# Patient Record
Sex: Female | Born: 1980
Health system: Southern US, Community
[De-identification: ages and names within clinical notes are randomized; demographics above are authoritative.]

## PROBLEM LIST (undated history)

## (undated) ENCOUNTER — Inpatient Hospital Stay (HOSPITAL_COMMUNITY): Payer: Self-pay

## (undated) DIAGNOSIS — R12 Heartburn: Secondary | ICD-10-CM

## (undated) DIAGNOSIS — J45909 Unspecified asthma, uncomplicated: Secondary | ICD-10-CM

## (undated) DIAGNOSIS — K829 Disease of gallbladder, unspecified: Secondary | ICD-10-CM

## (undated) DIAGNOSIS — M2550 Pain in unspecified joint: Secondary | ICD-10-CM

## (undated) DIAGNOSIS — R0602 Shortness of breath: Secondary | ICD-10-CM

## (undated) DIAGNOSIS — Z8619 Personal history of other infectious and parasitic diseases: Secondary | ICD-10-CM

## (undated) DIAGNOSIS — Z91018 Allergy to other foods: Secondary | ICD-10-CM

## (undated) DIAGNOSIS — E559 Vitamin D deficiency, unspecified: Secondary | ICD-10-CM

## (undated) DIAGNOSIS — J302 Other seasonal allergic rhinitis: Secondary | ICD-10-CM

## (undated) DIAGNOSIS — R7303 Prediabetes: Secondary | ICD-10-CM

## (undated) DIAGNOSIS — M1990 Unspecified osteoarthritis, unspecified site: Secondary | ICD-10-CM

## (undated) DIAGNOSIS — O139 Gestational [pregnancy-induced] hypertension without significant proteinuria, unspecified trimester: Secondary | ICD-10-CM

## (undated) DIAGNOSIS — F32A Depression, unspecified: Secondary | ICD-10-CM

## (undated) DIAGNOSIS — O24419 Gestational diabetes mellitus in pregnancy, unspecified control: Secondary | ICD-10-CM

## (undated) DIAGNOSIS — L509 Urticaria, unspecified: Secondary | ICD-10-CM

## (undated) DIAGNOSIS — G4730 Sleep apnea, unspecified: Secondary | ICD-10-CM

## (undated) DIAGNOSIS — F419 Anxiety disorder, unspecified: Secondary | ICD-10-CM

## (undated) DIAGNOSIS — I10 Essential (primary) hypertension: Secondary | ICD-10-CM

## (undated) HISTORY — DX: Prediabetes: R73.03

## (undated) HISTORY — PX: TONSILLECTOMY: SUR1361

## (undated) HISTORY — DX: Pain in unspecified joint: M25.50

## (undated) HISTORY — DX: Unspecified asthma, uncomplicated: J45.909

## (undated) HISTORY — PX: TYMPANOPLASTY: SHX33

## (undated) HISTORY — DX: Depression, unspecified: F32.A

## (undated) HISTORY — DX: Sleep apnea, unspecified: G47.30

## (undated) HISTORY — DX: Heartburn: R12

## (undated) HISTORY — DX: Personal history of other infectious and parasitic diseases: Z86.19

## (undated) HISTORY — DX: Gestational diabetes mellitus in pregnancy, unspecified control: O24.419

## (undated) HISTORY — DX: Urticaria, unspecified: L50.9

## (undated) HISTORY — DX: Disease of gallbladder, unspecified: K82.9

## (undated) HISTORY — DX: Vitamin D deficiency, unspecified: E55.9

## (undated) HISTORY — DX: Unspecified osteoarthritis, unspecified site: M19.90

## (undated) HISTORY — DX: Essential (primary) hypertension: I10

## (undated) HISTORY — DX: Allergy to other foods: Z91.018

## (undated) HISTORY — DX: Shortness of breath: R06.02

---

## 2007-08-25 ENCOUNTER — Encounter: Admission: RE | Admit: 2007-08-25 | Discharge: 2007-08-25 | Payer: Self-pay | Admitting: Orthopedic Surgery

## 2007-10-19 ENCOUNTER — Inpatient Hospital Stay (HOSPITAL_COMMUNITY): Admission: AD | Admit: 2007-10-19 | Discharge: 2007-10-21 | Payer: Self-pay | Admitting: Obstetrics and Gynecology

## 2007-10-21 ENCOUNTER — Inpatient Hospital Stay (HOSPITAL_COMMUNITY): Admission: AD | Admit: 2007-10-21 | Discharge: 2007-10-22 | Payer: Self-pay | Admitting: Obstetrics and Gynecology

## 2011-06-02 LAB — COMPREHENSIVE METABOLIC PANEL
ALT: 18
AST: 18
Albumin: 2.6 — ABNORMAL LOW
Alkaline Phosphatase: 156 — ABNORMAL HIGH
BUN: 6
CO2: 21
Calcium: 8.9
Chloride: 103
Creatinine, Ser: 0.53
GFR calc Af Amer: >60
GFR calc non Af Amer: >60
Glucose, Bld: 107 — ABNORMAL HIGH
Potassium: 3.4 — ABNORMAL LOW
Sodium: 134 — ABNORMAL LOW
Total Bilirubin: 0.6
Total Protein: 6.1

## 2011-06-02 LAB — CBC
HCT: 32.7 — ABNORMAL LOW
HCT: 38
Hemoglobin: 11.4 — ABNORMAL LOW
Hemoglobin: 13.1
MCHC: 34.5
MCHC: 34.9
MCV: 83.9
MCV: 84.5
Platelets: 207
Platelets: 219
RBC: 3.89
RBC: 4.5
RDW: 13.8
RDW: 13.8
WBC: 10.8 — ABNORMAL HIGH
WBC: 17.8 — ABNORMAL HIGH

## 2011-06-02 LAB — RPR: RPR Ser Ql: NONREACTIVE

## 2011-06-02 LAB — CCBB MATERNAL DONOR DRAW

## 2011-06-02 LAB — URIC ACID: Uric Acid, Serum: 4.7

## 2011-06-02 LAB — LACTATE DEHYDROGENASE: LDH: 114

## 2011-12-11 LAB — OB RESULTS CONSOLE HEPATITIS B SURFACE ANTIGEN: Hepatitis B Surface Ag: NEGATIVE

## 2011-12-11 LAB — OB RESULTS CONSOLE ABO/RH: RH Type: POSITIVE

## 2011-12-11 LAB — OB RESULTS CONSOLE HGB/HCT, BLOOD
HCT: 43 %
Hemoglobin: 14.5 g/dL

## 2011-12-11 LAB — OB RESULTS CONSOLE HIV ANTIBODY (ROUTINE TESTING): HIV: NONREACTIVE

## 2011-12-11 LAB — OB RESULTS CONSOLE RPR: RPR: NONREACTIVE

## 2011-12-11 LAB — OB RESULTS CONSOLE PLATELET COUNT: Platelets: 258 10*3/uL

## 2011-12-11 LAB — OB RESULTS CONSOLE GC/CHLAMYDIA
Chlamydia: NEGATIVE
Gonorrhea: NEGATIVE

## 2011-12-11 LAB — OB RESULTS CONSOLE ANTIBODY SCREEN: Antibody Screen: NEGATIVE

## 2011-12-11 LAB — OB RESULTS CONSOLE RUBELLA ANTIBODY, IGM: Rubella: IMMUNE

## 2012-02-05 ENCOUNTER — Inpatient Hospital Stay (HOSPITAL_COMMUNITY): Payer: Managed Care, Other (non HMO)

## 2012-02-05 ENCOUNTER — Inpatient Hospital Stay (HOSPITAL_COMMUNITY)
Admission: AD | Admit: 2012-02-05 | Discharge: 2012-02-06 | Disposition: A | Discharge status: Home / Self Care | Payer: Managed Care, Other (non HMO) | Source: Ambulatory Visit | Attending: Obstetrics and Gynecology | Admitting: Obstetrics and Gynecology

## 2012-02-05 ENCOUNTER — Encounter (HOSPITAL_COMMUNITY): Payer: Self-pay | Admitting: *Deleted

## 2012-02-05 DIAGNOSIS — O4692 Antepartum hemorrhage, unspecified, second trimester: Secondary | ICD-10-CM

## 2012-02-05 DIAGNOSIS — O209 Hemorrhage in early pregnancy, unspecified: Secondary | ICD-10-CM | POA: Insufficient documentation

## 2012-02-05 NOTE — MAU Note (Signed)
S. [place], CNM at bedside.  Assessment done and poc discussed with pt.  

## 2012-02-05 NOTE — MAU Provider Note (Signed)
Chief Complaint:  Vaginal Bleeding    First Provider Initiated Contact with Patient 02/05/12 2241      Heidi Schmitt is  31 y.o. G1P0.  No LMP recorded. Patient is pregnant..  [redacted]w[redacted]d   She presents complaining of Vaginal Bleeding . 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 Began having cramping afterward and thought it was due to all the work. At 8:40 had heavy bleeding "like the worst day of my period" and some small clots, has been to the restroom 3 more times since then and is still bleeding but not as much. Cramping has continued.      Obstetrical/Gynecological History: G2P1001  Past Medical History: History reviewed. No pertinent past medical history.  Past Surgical History: Past Surgical History  Procedure Date  . Tonsillectomy     Family History: History reviewed. No pertinent family history.  Social History: History  Substance Use Topics  . Smoking status: Never Smoker   . Smokeless tobacco: Not on file  . Alcohol Use: No    Allergies:  Allergies  Allergen Reactions  . Zithromax (Azithromycin) Other (See Comments)    Neurological losses. Cannot sense the difference between hot and cold    Prescriptions prior to admission  Medication Sig Dispense Refill  . acetaminophen (TYLENOL) 325 MG tablet Take 650 mg by mouth every 6 (six) hours as needed. As needed for pain      . busPIRone (BUSPAR) 15 MG tablet Take 15 mg by mouth every other day. Takes medication on even days.      . fluticasone (FLONASE) 50 MCG/ACT nasal spray Place 2 sprays into the nose daily.      Marland Kitchen loratadine (CLARITIN) 10 MG tablet Take 10 mg by mouth daily. For allergy congestion      . montelukast (SINGULAIR) 10 MG tablet Take 10 mg by mouth at bedtime. For allergies      . pantoprazole (PROTONIX) 40 MG tablet Take 40 mg by mouth daily.      . Prenatal Vit-Fe Fumarate-FA (PRENATAL MULTIVITAMIN) TABS Take 1 tablet by mouth daily.        Review of Systems - Negative except  what has been reviewed in the HPI  Physical Exam   Blood pressure 154/95, pulse 87, temperature 98.7 F (37.1 C), temperature source Oral, resp. rate 18, height 5\' 6"  (1.676 m), weight 234 lb (106.142 kg), SpO2 97.00%.   General: General appearance - alert, well appearing, and in no distress, oriented to person, place, and time and overweight Mental status - alert, oriented to person, place, and time, normal mood, behavior, speech, dress, motor activity, and thought processes Abdomen - soft, nontender, nondistended, no masses or organomegaly gravid Focused Gynecological Exam: VULVA: normal appearing vulva with no masses, tenderness or lesions, VAGINA: vaginal discharge - scant and pink/red noted in vault, cleared with single fox swab. No evidence of active bleeding, CERVIX: closed/thick/firm  MD Consult: Discussed with Dr. Henderson Cloud  Labs: O positive noted in PN Record  Imaging Studies:  OB Limited: FHR 153, Breech presentation, Placenta post/above os with no signs of abruption. CL: 4.2cm   Assessment: Vaginal Bleeding in pregnancy  Plan: Discharge home Pelvic rest and lifting restrictions FU in office this week.  Hermes Wafer E. 02/06/2012,12:03 AM

## 2012-02-05 NOTE — MAU Note (Signed)
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 Began having cramping afterward and thought it was due to all the work. At 8:40 had heavy bleeding "like the worst day of my period" and some small clots, has been to the restroom 3 more times since then and is still bleeding but not as much. Cramping has continued.

## 2012-02-05 NOTE — Progress Notes (Signed)
SSE per CNM.  Bleeding viewed.  VE done.

## 2012-02-06 ENCOUNTER — Encounter (HOSPITAL_COMMUNITY): Payer: Self-pay | Admitting: *Deleted

## 2012-02-06 NOTE — Discharge Instructions (Signed)
Vaginal Bleeding During Pregnancy, Second Trimester  A small amount of bleeding (spotting) is relatively common in pregnancy. It usually stops on its own. There are many causes for bleeding or spotting in pregnancy. Some bleeding may be related to the pregnancy and some may not. Cramping with the bleeding is more serious and concerning. Tell your caregiver if you have any vaginal bleeding.   CAUSES    Infection, inflammation or growths on the cervix.   The placenta may partially or completely be covering the opening of the cervix inside the uterus.   The placenta may have separated from the uterus.   You may be having early/preterm labor.   The cervix is not strong enough to keep a baby inside the uterus (cervical insufficiency).   Many tiny cysts in the uterus instead of pregnancy tissue (molar pregnancy)  SYMPTOMS    Vaginal spotting or bleeding with or without cramps.   Uterine contractions.   Abnormal vaginal discharge.   You may have spotting or spotting after having sexual intercourse.  DIAGNOSIS   To evaluate the pregnancy, your caregiver may:   Do a pelvic exam.   Take blood tests.   Do an ultrasound.  It is very important to follow your caregiver's instructions.   TREATMENT    Evaluation of the pregnancy with blood tests and ultrasound.   Bed rest (getting up to use the bathroom only).   Rho-gam immunization if the mother is Rh negative and the father is Rh positive.   If you are having uterine contractions, you may be given medication to stop the contractions.   If you have cervical insufficiency, you may have a suture placed in the cervix to close it.  HOME CARE INSTRUCTIONS    If your caregiver orders bed rest, you may need to make arrangements for the care of other children and for any other responsibilities. However, your caregiver may allow you to continue light activity.   Keep track of the number of pads you use each day and how soaked (saturated) they are. Write this down.   Do  not use tampons. Do not douche.   Do not have sexual intercourse or orgasms until approved by your physician.   Save any tissue that you pass for your caregiver to see.   Take medicine for cramps only with your caregiver's permission.   Do not take aspirin because it can make you bleed.   Do not exercise, do any strenuous activities or heavy lifting without your caregiver's permission.  SEEK IMMEDIATE MEDICAL CARE IF:    You experience severe cramps in your stomach, back or belly (abdomen).   You have uterine contractions.   You have an oral temperature above 102 F (38.9 C), not controlled by medicine.   You develop chills.   You pass large clots or tissue.   Your bleeding increases or you become light-headed, weak or have fainting episodes.   You have leaking or a gush of fluid from your vagina.  Document Released: 06/07/2005 Document Revised: 08/17/2011 Document Reviewed: 12/17/2008  [employer] Patient Information 2012 [employer].

## 2012-05-14 ENCOUNTER — Encounter: Payer: Self-pay | Admitting: *Deleted

## 2012-05-14 ENCOUNTER — Encounter: Payer: Managed Care, Other (non HMO) | Attending: Obstetrics and Gynecology | Admitting: *Deleted

## 2012-05-14 DIAGNOSIS — Z713 Dietary counseling and surveillance: Secondary | ICD-10-CM | POA: Insufficient documentation

## 2012-05-14 DIAGNOSIS — O99810 Abnormal glucose complicating pregnancy: Secondary | ICD-10-CM | POA: Insufficient documentation

## 2012-05-14 NOTE — Progress Notes (Signed)
  Patient was seen on 05/14/2012 for Gestational Diabetes self-management visit at the [facility]. The following learning objectives were met by the patient during this appointment:   States the definition of Gestational Diabetes  States why dietary management is important in controlling blood glucose  Describes the effects each nutrient has on blood glucose levels  Demonstrates ability to create a balanced meal plan  Demonstrates carbohydrate counting   States when to check blood glucose levels  Demonstrates proper blood glucose monitoring techniques  States the effect of stress and exercise on blood glucose levels  States the importance of limiting caffeine and abstaining from alcohol and smoking  Blood glucose monitor given:  One Touch Ultra Mini Self Monitoring Kit Lot # [id number] X Exp: 04/30/2013 Blood glucose reading: 125 mg/dl  Patient instructed to monitor glucose levels: FBS: 60 - <90 2 hour: <120  *Patient received handouts:  Nutrition Diabetes and Pregnancy  Carbohydrate Counting List  Patient will be seen for follow-up as needed.

## 2012-05-23 ENCOUNTER — Telehealth: Payer: Self-pay | Admitting: *Deleted

## 2012-06-20 LAB — OB RESULTS CONSOLE GBS: GBS: NEGATIVE

## 2012-07-01 ENCOUNTER — Inpatient Hospital Stay (HOSPITAL_COMMUNITY)
Admission: AD | Admit: 2012-07-01 | Discharge: 2012-07-01 | Disposition: A | Discharge status: Home / Self Care | Payer: Managed Care, Other (non HMO) | Source: Ambulatory Visit | Attending: Obstetrics and Gynecology | Admitting: Obstetrics and Gynecology

## 2012-07-01 ENCOUNTER — Encounter (HOSPITAL_COMMUNITY): Payer: Self-pay

## 2012-07-01 DIAGNOSIS — R42 Dizziness and giddiness: Secondary | ICD-10-CM | POA: Insufficient documentation

## 2012-07-01 DIAGNOSIS — R51 Headache: Secondary | ICD-10-CM | POA: Insufficient documentation

## 2012-07-01 DIAGNOSIS — O139 Gestational [pregnancy-induced] hypertension without significant proteinuria, unspecified trimester: Secondary | ICD-10-CM | POA: Insufficient documentation

## 2012-07-01 HISTORY — DX: Anxiety disorder, unspecified: F41.9

## 2012-07-01 HISTORY — DX: Gestational (pregnancy-induced) hypertension without significant proteinuria, unspecified trimester: O13.9

## 2012-07-01 HISTORY — DX: Other seasonal allergic rhinitis: J30.2

## 2012-07-01 LAB — COMPREHENSIVE METABOLIC PANEL
ALT: 10 U/L (ref 0–35)
AST: 13 U/L (ref 0–37)
Albumin: 2.8 g/dL — ABNORMAL LOW (ref 3.5–5.2)
Alkaline Phosphatase: 175 U/L — ABNORMAL HIGH (ref 39–117)
BUN: 9 mg/dL (ref 6–23)
CO2: 20 mEq/L (ref 19–32)
Calcium: 9.2 mg/dL (ref 8.4–10.5)
Chloride: 103 mEq/L (ref 96–112)
Creatinine, Ser: 0.59 mg/dL (ref 0.50–1.10)
GFR calc Af Amer: >90 mL/min (ref >90)
GFR calc non Af Amer: >90 mL/min (ref >90)
Glucose, Bld: 74 mg/dL (ref 70–99)
Potassium: 3.5 mEq/L (ref 3.5–5.1)
Sodium: 135 mEq/L (ref 135–145)
Total Bilirubin: 0.3 mg/dL (ref 0.3–1.2)
Total Protein: 6.3 g/dL (ref 6.0–8.3)

## 2012-07-01 LAB — CBC
HCT: 37.3 % (ref 36.0–46.0)
Hemoglobin: 12.8 g/dL (ref 12.0–15.0)
MCH: 28.8 pg (ref 26.0–34.0)
MCHC: 34.3 g/dL (ref 30.0–36.0)
MCV: 84 fL (ref 78.0–100.0)
Platelets: 180 10*3/uL (ref 150–400)
RBC: 4.44 MIL/uL (ref 3.87–5.11)
RDW: 13.4 % (ref 11.5–15.5)
WBC: 10.5 10*3/uL (ref 4.0–10.5)

## 2012-07-01 LAB — URINALYSIS, ROUTINE W REFLEX MICROSCOPIC
Bilirubin Urine: NEGATIVE
Glucose, UA: 100 mg/dL — AB
Hgb urine dipstick: NEGATIVE
Ketones, ur: NEGATIVE mg/dL
Leukocytes, UA: NEGATIVE
Nitrite: NEGATIVE
Protein, ur: NEGATIVE mg/dL
Specific Gravity, Urine: 1.02 (ref 1.005–1.030)
Urobilinogen, UA: 0.2 mg/dL (ref 0.0–1.0)
pH: 6 (ref 5.0–8.0)

## 2012-07-01 LAB — LACTATE DEHYDROGENASE: LDH: 179 U/L (ref 94–250)

## 2012-07-01 LAB — URIC ACID: Uric Acid, Serum: 5.4 mg/dL (ref 2.4–7.0)

## 2012-07-01 MED ORDER — ACETAMINOPHEN 500 MG PO TABS: 1000.0000 mg | ORAL_TABLET | Freq: Once | ORAL | Status: DC

## 2012-07-01 NOTE — MAU Note (Signed)
Pt states woke up at 0130 this am, felt dizzy, had headache that began then, has now eased off, however is "annoying" and pt states she just wants to sleep. Denies bleeding or lof. Rates headache 4/10 at present. Has not taken meds for pain.

## 2012-07-01 NOTE — MAU Note (Signed)
Patient states she started having symptoms at 0130 this am with dizziness, headache and visual changes. States her husband is a Charity fundraiser and took her blood pressure and it was 200/90. Was elevated in the office last week. States she is having irregular contractions. Has felt some movement today but was on a field trip with her child and not always aware of movement. Denies bleeding or leaking.

## 2012-07-01 NOTE — MAU Provider Note (Signed)
History     CSN: [id number]  Arrival date and time: 07/01/12 1505   None     Chief Complaint  Patient presents with  . Hypertension  . Dizziness  . Headache   HPI 31 y.o. G2P1001 at [redacted]w[redacted]d c/o elevated BP. Awoke in the middle of the night dizzy and with severe headache. Her partner, who is a Charity fundraiser, took her blood pressure and found it to be 200/90. States headache has continued throughout the day, now mild, has not taken any medication for headache. Also reports blurred vision. No abd pain. States that her blood pressure became elevated at the same EGA in her last pregnancy and she was induced at 38 weeks d/t hypertension. Irregular contractions. + fetal movement.   Past Medical History  Diagnosis Date  . Diabetes mellitus   . Anxiety   . Seasonal allergies   . Pregnancy induced hypertension     Past Surgical History  Procedure Date  . Tonsillectomy     Family History  Problem Relation Age of Onset  . Other Neg Hx     History  Substance Use Topics  . Smoking status: Never Smoker   . Smokeless tobacco: Never Used  . Alcohol Use: No    Allergies:  Allergies  Allergen Reactions  . Zithromax (Azithromycin) Other (See Comments)    Neurological losses. Cannot sense the difference between hot and cold    Prescriptions prior to admission  Medication Sig Dispense Refill  . acetaminophen (TYLENOL) 325 MG tablet Take 650 mg by mouth every 6 (six) hours as needed. for pain      . azelastine (ASTELIN) 137 MCG/SPRAY nasal spray Place 1 spray into the nose daily as needed. Use in each nostril as directed for allergies/congestion      . cetirizine (ZYRTEC) 10 MG tablet Take 10 mg by mouth daily.      . fluticasone (FLONASE) 50 MCG/ACT nasal spray Place 2 sprays into the nose daily.      . montelukast (SINGULAIR) 10 MG tablet Take 10 mg by mouth at bedtime. For allergies      . pantoprazole (PROTONIX) 40 MG tablet Take 40 mg by mouth 2 (two) times daily.       . Prenatal  Vit-Fe Fumarate-FA (PRENATAL MULTIVITAMIN) TABS Take 1 tablet by mouth daily.        Review of Systems  Constitutional: Negative.   Respiratory: Negative.   Cardiovascular: Negative.   Gastrointestinal: Negative for nausea, vomiting, abdominal pain, diarrhea and constipation.  Genitourinary: Negative for dysuria, urgency, frequency, hematuria and flank pain.       Negative for vaginal bleeding, cramping/contractions  Musculoskeletal: Negative.   Neurological: Negative.   Psychiatric/Behavioral: Negative.    Physical Exam   Blood pressure 174/98, pulse 86, temperature 98 F (36.7 C), temperature source Oral, resp. rate 18, height 5\' 6"  (1.676 m), weight 250 lb (113.399 kg), SpO2 100.00%.  Filed Vitals:   07/01/12 1706 07/01/12 1715 07/01/12 1730 07/01/12 1745  BP: 136/73 147/74 141/64 126/63  Pulse: 87 84 78 83  Temp:      TempSrc:      Resp:      Height:      Weight:      SpO2:         Physical Exam  Nursing note and vitals reviewed. Constitutional: She is oriented to person, place, and time. She appears well-developed and well-nourished. No distress.  Cardiovascular: Normal rate.   Respiratory: Effort normal.  GI: Soft. There  is no tenderness.  Musculoskeletal: Normal range of motion.  Neurological: She is alert and oriented to person, place, and time. She has normal reflexes.  Skin: Skin is warm and dry.  Psychiatric: She has a normal mood and affect.    MAU Course  Procedures Results for orders placed during the hospital encounter of 07/01/12 (from the past 72 hour(s))  URINALYSIS, ROUTINE W REFLEX MICROSCOPIC     Status: Abnormal   Collection Time   07/01/12  3:35 PM      Component Value Range Comment   Color, Urine YELLOW  YELLOW    APPearance CLEAR  CLEAR    Specific Gravity, Urine 1.020  1.005 - 1.030    pH 6.0  5.0 - 8.0    Glucose, UA 100 (*) NEGATIVE mg/dL    Hgb urine dipstick NEGATIVE  NEGATIVE    Bilirubin Urine NEGATIVE  NEGATIVE    Ketones, ur  NEGATIVE  NEGATIVE mg/dL    Protein, ur NEGATIVE  NEGATIVE mg/dL    Urobilinogen, UA 0.2  0.0 - 1.0 mg/dL    Nitrite NEGATIVE  NEGATIVE    Leukocytes, UA NEGATIVE  NEGATIVE MICROSCOPIC NOT DONE ON URINES WITH NEGATIVE PROTEIN, BLOOD, LEUKOCYTES, NITRITE, OR GLUCOSE <1000 mg/dL.  COMPREHENSIVE METABOLIC PANEL     Status: Abnormal   Collection Time   07/01/12  4:29 PM      Component Value Range Comment   Sodium 135  135 - 145 mEq/L    Potassium 3.5  3.5 - 5.1 mEq/L    Chloride 103  96 - 112 mEq/L    CO2 20  19 - 32 mEq/L    Glucose, Bld 74  70 - 99 mg/dL    BUN 9  6 - 23 mg/dL    Creatinine, Ser 9.60  0.50 - 1.10 mg/dL    Calcium 9.2  8.4 - 45.4 mg/dL    Total Protein 6.3  6.0 - 8.3 g/dL    Albumin 2.8 (*) 3.5 - 5.2 g/dL    AST 13  0 - 37 U/L    ALT 10  0 - 35 U/L    Alkaline Phosphatase 175 (*) 39 - 117 U/L    Total Bilirubin 0.3  0.3 - 1.2 mg/dL    GFR calc non Af Amer >90  >90 mL/min    GFR calc Af Amer >90  >90 mL/min   CBC     Status: Normal   Collection Time   07/01/12  4:29 PM      Component Value Range Comment   WBC 10.5  4.0 - 10.5 K/uL    RBC 4.44  3.87 - 5.11 MIL/uL    Hemoglobin 12.8  12.0 - 15.0 g/dL    HCT 09.8  11.9 - 14.7 %    MCV 84.0  78.0 - 100.0 fL    MCH 28.8  26.0 - 34.0 pg    MCHC 34.3  30.0 - 36.0 g/dL    RDW 82.9  56.2 - 13.0 %    Platelets 180  150 - 400 K/uL   URIC ACID     Status: Normal   Collection Time   07/01/12  4:29 PM      Component Value Range Comment   Uric Acid, Serum 5.4  2.4 - 7.0 mg/dL   LACTATE DEHYDROGENASE     Status: Normal   Collection Time   07/01/12  4:29 PM      Component Value Range Comment   LDH 179  94 - 250 U/L      Assessment and Plan   1. PIH (pregnancy induced hypertension)       Medication List     As of 07/03/2012  4:41 PM    CONTINUE taking these medications         acetaminophen 325 MG tablet   Commonly known as: TYLENOL      azelastine 137 MCG/SPRAY nasal spray   Commonly known as: ASTELIN       cetirizine 10 MG tablet   Commonly known as: ZYRTEC      fluticasone 50 MCG/ACT nasal spray   Commonly known as: FLONASE      montelukast 10 MG tablet   Commonly known as: SINGULAIR      pantoprazole 40 MG tablet   Commonly known as: PROTONIX      prenatal multivitamin Tabs            Follow-up Information    Follow up with ADKINS,GRETCHEN, MD. On 07/03/2012. (or tomorrow if needed with worsening symptoms)    Contact information:   [address], [address] [place] [address] [phone]            FRAZIER,NATALIE 07/01/2012, 4:21 PM

## 2012-07-02 ENCOUNTER — Telehealth (HOSPITAL_COMMUNITY): Payer: Self-pay | Admitting: *Deleted

## 2012-07-02 ENCOUNTER — Encounter (HOSPITAL_COMMUNITY): Payer: Self-pay | Admitting: *Deleted

## 2012-07-02 NOTE — Telephone Encounter (Signed)
Preadmission screen  

## 2012-07-03 ENCOUNTER — Encounter (HOSPITAL_COMMUNITY): Payer: Self-pay

## 2012-07-03 ENCOUNTER — Inpatient Hospital Stay (HOSPITAL_COMMUNITY)
Admission: AD | Admit: 2012-07-03 | Discharge: 2012-07-06 | DRG: 765 | Disposition: A | Discharge status: Home / Self Care | Payer: Managed Care, Other (non HMO) | Source: Ambulatory Visit | Attending: Obstetrics & Gynecology | Admitting: Obstetrics & Gynecology

## 2012-07-03 DIAGNOSIS — O4590 Premature separation of placenta, unspecified, unspecified trimester: Secondary | ICD-10-CM | POA: Diagnosis present

## 2012-07-03 DIAGNOSIS — O139 Gestational [pregnancy-induced] hypertension without significant proteinuria, unspecified trimester: Principal | ICD-10-CM | POA: Diagnosis present

## 2012-07-03 DIAGNOSIS — O99814 Abnormal glucose complicating childbirth: Secondary | ICD-10-CM | POA: Diagnosis present

## 2012-07-03 LAB — COMPREHENSIVE METABOLIC PANEL
ALT: 12 U/L (ref 0–35)
AST: 27 U/L (ref 0–37)
Albumin: 2.9 g/dL — ABNORMAL LOW (ref 3.5–5.2)
Alkaline Phosphatase: 186 U/L — ABNORMAL HIGH (ref 39–117)
BUN: 7 mg/dL (ref 6–23)
CO2: 19 mEq/L (ref 19–32)
Calcium: 9.1 mg/dL (ref 8.4–10.5)
Chloride: 101 mEq/L (ref 96–112)
Creatinine, Ser: 0.59 mg/dL (ref 0.50–1.10)
GFR calc Af Amer: >90 mL/min (ref >90)
GFR calc non Af Amer: >90 mL/min (ref >90)
Glucose, Bld: 73 mg/dL (ref 70–99)
Potassium: 4.2 mEq/L (ref 3.5–5.1)
Sodium: 135 mEq/L (ref 135–145)
Total Bilirubin: 0.4 mg/dL (ref 0.3–1.2)
Total Protein: 6.1 g/dL (ref 6.0–8.3)

## 2012-07-03 LAB — TYPE AND SCREEN
ABO/RH(D): O POS
Antibody Screen: NEGATIVE

## 2012-07-03 LAB — CBC
HCT: 38.3 % (ref 36.0–46.0)
Hemoglobin: 13 g/dL (ref 12.0–15.0)
MCH: 28.6 pg (ref 26.0–34.0)
MCHC: 33.9 g/dL (ref 30.0–36.0)
MCV: 84.4 fL (ref 78.0–100.0)
Platelets: 198 10*3/uL (ref 150–400)
RBC: 4.54 MIL/uL (ref 3.87–5.11)
RDW: 13.4 % (ref 11.5–15.5)
WBC: 9.6 10*3/uL (ref 4.0–10.5)

## 2012-07-03 LAB — URIC ACID: Uric Acid, Serum: 4.9 mg/dL (ref 2.4–7.0)

## 2012-07-03 LAB — GLUCOSE, CAPILLARY
Glucose-Capillary: 79 mg/dL (ref 70–99)
Glucose-Capillary: 117 mg/dL — ABNORMAL HIGH (ref 70–99)

## 2012-07-03 LAB — LACTATE DEHYDROGENASE: LDH: 325 U/L — ABNORMAL HIGH (ref 94–250)

## 2012-07-03 LAB — ABO/RH: ABO/RH(D): O POS

## 2012-07-03 MED ORDER — EPHEDRINE 5 MG/ML INJ: 10.0000 mg | INTRAVENOUS | Status: DC | PRN

## 2012-07-03 MED ORDER — PHENYLEPHRINE 40 MCG/ML (10ML) SYRINGE FOR IV PUSH (FOR BLOOD PRESSURE SUPPORT)
80.0000 ug | PREFILLED_SYRINGE | INTRAVENOUS | Status: DC | PRN
  Filled 2012-07-03: qty 5

## 2012-07-03 MED ORDER — LACTATED RINGERS IV SOLN
500.0000 mL | INTRAVENOUS | Status: DC | PRN
  Administered 2012-07-04: 500 mL via INTRAVENOUS

## 2012-07-03 MED ORDER — OXYCODONE-ACETAMINOPHEN 5-325 MG PO TABS: 1.0000 | ORAL_TABLET | ORAL | Status: DC | PRN

## 2012-07-03 MED ORDER — ONDANSETRON HCL 4 MG/2ML IJ SOLN: 4.0000 mg | Freq: Four times a day (QID) | INTRAMUSCULAR | Status: DC | PRN

## 2012-07-03 MED ORDER — OXYTOCIN 40 UNITS IN LACTATED RINGERS INFUSION - SIMPLE MED: 62.5000 mL/h | INTRAVENOUS | Status: DC

## 2012-07-03 MED ORDER — CITRIC ACID-SODIUM CITRATE 334-500 MG/5ML PO SOLN
30.0000 mL | ORAL | Status: DC | PRN
  Administered 2012-07-04: 30 mL via ORAL
  Filled 2012-07-03: qty 15

## 2012-07-03 MED ORDER — EPHEDRINE 5 MG/ML INJ
10.0000 mg | INTRAVENOUS | Status: DC | PRN
  Filled 2012-07-03: qty 4

## 2012-07-03 MED ORDER — ACETAMINOPHEN 325 MG PO TABS: 650.0000 mg | ORAL_TABLET | ORAL | Status: DC | PRN

## 2012-07-03 MED ORDER — IBUPROFEN 600 MG PO TABS: 600.0000 mg | ORAL_TABLET | Freq: Four times a day (QID) | ORAL | Status: DC | PRN

## 2012-07-03 MED ORDER — PHENYLEPHRINE 40 MCG/ML (10ML) SYRINGE FOR IV PUSH (FOR BLOOD PRESSURE SUPPORT): 80.0000 ug | PREFILLED_SYRINGE | INTRAVENOUS | Status: DC | PRN

## 2012-07-03 MED ORDER — LACTATED RINGERS IV SOLN
INTRAVENOUS | Status: DC
  Administered 2012-07-03 – 2012-07-04 (×2): via INTRAVENOUS

## 2012-07-03 MED ORDER — LACTATED RINGERS IV SOLN: 500.0000 mL | Freq: Once | INTRAVENOUS | Status: DC

## 2012-07-03 MED ORDER — FLEET ENEMA 7-19 GM/118ML RE ENEM: 1.0000 | ENEMA | RECTAL | Status: DC | PRN

## 2012-07-03 MED ORDER — LIDOCAINE HCL (PF) 1 % IJ SOLN: 30.0000 mL | INTRAMUSCULAR | Status: DC | PRN

## 2012-07-03 MED ORDER — DIPHENHYDRAMINE HCL 50 MG/ML IJ SOLN: 12.5000 mg | INTRAMUSCULAR | Status: DC | PRN

## 2012-07-03 MED ORDER — FENTANYL 2.5 MCG/ML BUPIVACAINE 1/10 % EPIDURAL INFUSION (WH - ANES)
14.0000 mL/h | INTRAMUSCULAR | Status: DC
  Administered 2012-07-04: 14 mL/h via EPIDURAL
  Filled 2012-07-03: qty 125

## 2012-07-03 MED ORDER — MISOPROSTOL 25 MCG QUARTER TABLET
25.0000 ug | ORAL_TABLET | ORAL | Status: DC | PRN
  Administered 2012-07-03 – 2012-07-04 (×2): 25 ug via VAGINAL
  Filled 2012-07-03 (×2): qty 0.25

## 2012-07-03 MED ORDER — ZOLPIDEM TARTRATE 5 MG PO TABS: 5.0000 mg | ORAL_TABLET | Freq: Every evening | ORAL | Status: DC | PRN

## 2012-07-03 MED ORDER — OXYTOCIN BOLUS FROM INFUSION
500.0000 mL | INTRAVENOUS | Status: DC
  Filled 2012-07-03 (×42): qty 500

## 2012-07-03 MED ORDER — TERBUTALINE SULFATE 1 MG/ML IJ SOLN
0.2500 mg | Freq: Once | INTRAMUSCULAR | Status: AC | PRN
  Administered 2012-07-04: 0.125 mg via SUBCUTANEOUS

## 2012-07-04 ENCOUNTER — Encounter (HOSPITAL_COMMUNITY): Payer: Self-pay

## 2012-07-04 ENCOUNTER — Encounter (HOSPITAL_COMMUNITY)
Admission: AD | Disposition: A | Discharge status: Home / Self Care | Payer: Self-pay | Source: Ambulatory Visit | Attending: Obstetrics & Gynecology

## 2012-07-04 ENCOUNTER — Inpatient Hospital Stay (HOSPITAL_COMMUNITY): Payer: Managed Care, Other (non HMO) | Admitting: Anesthesiology

## 2012-07-04 ENCOUNTER — Encounter (HOSPITAL_COMMUNITY): Payer: Self-pay | Admitting: Anesthesiology

## 2012-07-04 LAB — GLUCOSE, CAPILLARY
Glucose-Capillary: 115 mg/dL — ABNORMAL HIGH (ref 70–99)
Glucose-Capillary: 81 mg/dL (ref 70–99)
Glucose-Capillary: 83 mg/dL (ref 70–99)
Glucose-Capillary: 144 mg/dL — ABNORMAL HIGH (ref 70–99)

## 2012-07-04 LAB — CBC
HCT: 36.4 % (ref 36.0–46.0)
HCT: 33.4 % — ABNORMAL LOW (ref 36.0–46.0)
Hemoglobin: 12.1 g/dL (ref 12.0–15.0)
Hemoglobin: 11.4 g/dL — ABNORMAL LOW (ref 12.0–15.0)
MCH: 28.1 pg (ref 26.0–34.0)
MCH: 28.8 pg (ref 26.0–34.0)
MCHC: 33.2 g/dL (ref 30.0–36.0)
MCHC: 34.1 g/dL (ref 30.0–36.0)
MCV: 84.7 fL (ref 78.0–100.0)
MCV: 84.3 fL (ref 78.0–100.0)
Platelets: 172 10*3/uL (ref 150–400)
Platelets: 162 10*3/uL (ref 150–400)
RBC: 4.3 MIL/uL (ref 3.87–5.11)
RBC: 3.96 MIL/uL (ref 3.87–5.11)
RDW: 13.5 % (ref 11.5–15.5)
RDW: 13.3 % (ref 11.5–15.5)
WBC: 9.9 10*3/uL (ref 4.0–10.5)
WBC: 16.2 10*3/uL — ABNORMAL HIGH (ref 4.0–10.5)

## 2012-07-04 LAB — RPR: RPR Ser Ql: NONREACTIVE

## 2012-07-04 SURGERY — Surgical Case
Anesthesia: Regional | Site: Abdomen | Wound class: Clean Contaminated

## 2012-07-04 MED ORDER — SCOPOLAMINE 1 MG/3DAYS TD PT72
MEDICATED_PATCH | TRANSDERMAL | Status: AC
  Administered 2012-07-04: 1.5 mg via TRANSDERMAL
  Filled 2012-07-04: qty 1

## 2012-07-04 MED ORDER — LIDOCAINE-EPINEPHRINE (PF) 2 %-1:200000 IJ SOLN
INTRAMUSCULAR | Status: AC
  Filled 2012-07-04: qty 20

## 2012-07-04 MED ORDER — NALBUPHINE HCL 10 MG/ML IJ SOLN
5.0000 mg | INTRAMUSCULAR | Status: DC | PRN
  Filled 2012-07-04: qty 1

## 2012-07-04 MED ORDER — DIPHENHYDRAMINE HCL 25 MG PO CAPS: 25.0000 mg | ORAL_CAPSULE | Freq: Four times a day (QID) | ORAL | Status: DC | PRN

## 2012-07-04 MED ORDER — SIMETHICONE 80 MG PO CHEW
80.0000 mg | CHEWABLE_TABLET | Freq: Three times a day (TID) | ORAL | Status: DC
  Administered 2012-07-04 – 2012-07-06 (×7): 80 mg via ORAL

## 2012-07-04 MED ORDER — OXYTOCIN 10 UNIT/ML IJ SOLN
40.0000 [IU] | INTRAVENOUS | Status: DC | PRN
  Administered 2012-07-04: 40 [IU] via INTRAVENOUS

## 2012-07-04 MED ORDER — ACETAMINOPHEN 10 MG/ML IV SOLN
1000.0000 mg | Freq: Four times a day (QID) | INTRAVENOUS | Status: AC | PRN
  Filled 2012-07-04: qty 100

## 2012-07-04 MED ORDER — LACTATED RINGERS IV SOLN
INTRAVENOUS | Status: DC
  Administered 2012-07-04: 19:00:00 via INTRAVENOUS
  Administered 2012-07-04: 125 mL/h via INTRAVENOUS

## 2012-07-04 MED ORDER — CEFAZOLIN SODIUM-DEXTROSE 2-3 GM-% IV SOLR
INTRAVENOUS | Status: DC | PRN
  Administered 2012-07-04: 2 g via INTRAVENOUS

## 2012-07-04 MED ORDER — SODIUM CHLORIDE 0.9 % IJ SOLN: 3.0000 mL | INTRAMUSCULAR | Status: DC | PRN

## 2012-07-04 MED ORDER — SODIUM BICARBONATE 8.4 % IV SOLN
INTRAVENOUS | Status: AC
  Filled 2012-07-04: qty 50

## 2012-07-04 MED ORDER — LACTATED RINGERS IV SOLN
INTRAVENOUS | Status: DC | PRN
  Administered 2012-07-04 (×2): via INTRAVENOUS

## 2012-07-04 MED ORDER — OXYTOCIN 40 UNITS IN LACTATED RINGERS INFUSION - SIMPLE MED: 62.5000 mL/h | INTRAVENOUS | Status: AC

## 2012-07-04 MED ORDER — OXYCODONE-ACETAMINOPHEN 5-325 MG PO TABS
1.0000 | ORAL_TABLET | ORAL | Status: DC | PRN
  Administered 2012-07-04 – 2012-07-05 (×3): 2 via ORAL
  Administered 2012-07-05: 0.5 via ORAL
  Administered 2012-07-06: 2 via ORAL
  Filled 2012-07-04 (×2): qty 2
  Filled 2012-07-04: qty 1
  Filled 2012-07-04 (×3): qty 2

## 2012-07-04 MED ORDER — MIDAZOLAM HCL 2 MG/2ML IJ SOLN
0.5000 mg | Freq: Once | INTRAMUSCULAR | Status: DC | PRN
Start: 2012-07-04 — End: 2012-07-04

## 2012-07-04 MED ORDER — KETAMINE HCL 100 MG/ML IJ SOLN
INTRAMUSCULAR | Status: AC
  Filled 2012-07-04: qty 1

## 2012-07-04 MED ORDER — PROMETHAZINE HCL 25 MG/ML IJ SOLN: 6.2500 mg | INTRAMUSCULAR | Status: DC | PRN

## 2012-07-04 MED ORDER — CEFAZOLIN SODIUM-DEXTROSE 2-3 GM-% IV SOLR
INTRAVENOUS | Status: AC
  Filled 2012-07-04: qty 50

## 2012-07-04 MED ORDER — MEPERIDINE HCL 25 MG/ML IJ SOLN: 6.2500 mg | INTRAMUSCULAR | Status: DC | PRN

## 2012-07-04 MED ORDER — TERBUTALINE SULFATE 1 MG/ML IJ SOLN
INTRAMUSCULAR | Status: AC
  Administered 2012-07-04: 0.125 mg via SUBCUTANEOUS
  Filled 2012-07-04: qty 1

## 2012-07-04 MED ORDER — LIDOCAINE HCL (PF) 1 % IJ SOLN
INTRAMUSCULAR | Status: DC | PRN
  Administered 2012-07-04 (×2): 5 mL

## 2012-07-04 MED ORDER — ONDANSETRON HCL 4 MG PO TABS: 4.0000 mg | ORAL_TABLET | ORAL | Status: DC | PRN

## 2012-07-04 MED ORDER — MENTHOL 3 MG MT LOZG: 1.0000 | LOZENGE | OROMUCOSAL | Status: DC | PRN

## 2012-07-04 MED ORDER — MIDAZOLAM HCL 2 MG/2ML IJ SOLN
INTRAMUSCULAR | Status: AC
  Filled 2012-07-04: qty 2

## 2012-07-04 MED ORDER — FENTANYL CITRATE 0.05 MG/ML IJ SOLN
INTRAMUSCULAR | Status: DC | PRN
  Administered 2012-07-04: 100 ug via INTRAVENOUS

## 2012-07-04 MED ORDER — FENTANYL CITRATE 0.05 MG/ML IJ SOLN
INTRAMUSCULAR | Status: AC
  Filled 2012-07-04: qty 2

## 2012-07-04 MED ORDER — NALOXONE HCL 0.4 MG/ML IJ SOLN: 0.4000 mg | INTRAMUSCULAR | Status: DC | PRN

## 2012-07-04 MED ORDER — FENTANYL CITRATE 0.05 MG/ML IJ SOLN
25.0000 ug | INTRAMUSCULAR | Status: DC | PRN
  Administered 2012-07-04: 25 ug via INTRAVENOUS

## 2012-07-04 MED ORDER — PANTOPRAZOLE SODIUM 40 MG PO TBEC
DELAYED_RELEASE_TABLET | ORAL | Status: AC
  Administered 2012-07-04: 40 mg via ORAL
  Filled 2012-07-04: qty 1

## 2012-07-04 MED ORDER — KETAMINE HCL 10 MG/ML IJ SOLN
INTRAMUSCULAR | Status: DC | PRN
  Administered 2012-07-04: 30 mg via INTRAVENOUS

## 2012-07-04 MED ORDER — AZELASTINE HCL 0.1 % NA SOLN: 1.0000 | Freq: Every day | NASAL | Status: DC | PRN

## 2012-07-04 MED ORDER — DIBUCAINE 1 % RE OINT: 1.0000 "application " | TOPICAL_OINTMENT | RECTAL | Status: DC | PRN

## 2012-07-04 MED ORDER — PHENYLEPHRINE HCL 10 MG/ML IJ SOLN
INTRAMUSCULAR | Status: DC | PRN
  Administered 2012-07-04: 120 ug via INTRAVENOUS
  Administered 2012-07-04: 40 ug via INTRAVENOUS

## 2012-07-04 MED ORDER — SODIUM CHLORIDE 0.9 % IR SOLN
Status: DC | PRN
  Administered 2012-07-04: 1000 mL

## 2012-07-04 MED ORDER — METOCLOPRAMIDE HCL 5 MG/ML IJ SOLN: 10.0000 mg | Freq: Three times a day (TID) | INTRAMUSCULAR | Status: DC | PRN

## 2012-07-04 MED ORDER — ONDANSETRON HCL 4 MG/2ML IJ SOLN
INTRAMUSCULAR | Status: DC | PRN
  Administered 2012-07-04: 4 mg via INTRAVENOUS

## 2012-07-04 MED ORDER — ONDANSETRON HCL 4 MG/2ML IJ SOLN
INTRAMUSCULAR | Status: AC
  Filled 2012-07-04: qty 2

## 2012-07-04 MED ORDER — MORPHINE SULFATE (PF) 0.5 MG/ML IJ SOLN
INTRAMUSCULAR | Status: DC | PRN
  Administered 2012-07-04: 3 mg via EPIDURAL

## 2012-07-04 MED ORDER — KETOROLAC TROMETHAMINE 30 MG/ML IJ SOLN: 30.0000 mg | Freq: Four times a day (QID) | INTRAMUSCULAR | Status: AC | PRN

## 2012-07-04 MED ORDER — PRENATAL MULTIVITAMIN CH
1.0000 | ORAL_TABLET | Freq: Every day | ORAL | Status: DC
  Administered 2012-07-05: 1 via ORAL
  Filled 2012-07-04 (×2): qty 1

## 2012-07-04 MED ORDER — SENNOSIDES-DOCUSATE SODIUM 8.6-50 MG PO TABS
2.0000 | ORAL_TABLET | Freq: Every day | ORAL | Status: DC
  Administered 2012-07-04 – 2012-07-05 (×2): 2 via ORAL

## 2012-07-04 MED ORDER — OXYTOCIN 10 UNIT/ML IJ SOLN
INTRAMUSCULAR | Status: AC
  Filled 2012-07-04: qty 4

## 2012-07-04 MED ORDER — ZOLPIDEM TARTRATE 5 MG PO TABS: 5.0000 mg | ORAL_TABLET | Freq: Every evening | ORAL | Status: DC | PRN

## 2012-07-04 MED ORDER — IBUPROFEN 600 MG PO TABS
600.0000 mg | ORAL_TABLET | Freq: Four times a day (QID) | ORAL | Status: DC
  Administered 2012-07-04 – 2012-07-06 (×7): 600 mg via ORAL
  Filled 2012-07-04 (×7): qty 1

## 2012-07-04 MED ORDER — LORATADINE 10 MG PO TABS
10.0000 mg | ORAL_TABLET | Freq: Every day | ORAL | Status: DC
  Filled 2012-07-04 (×3): qty 1

## 2012-07-04 MED ORDER — DIPHENHYDRAMINE HCL 50 MG/ML IJ SOLN: 25.0000 mg | INTRAMUSCULAR | Status: DC | PRN

## 2012-07-04 MED ORDER — LANOLIN HYDROUS EX OINT: 1.0000 "application " | TOPICAL_OINTMENT | CUTANEOUS | Status: DC | PRN

## 2012-07-04 MED ORDER — TETANUS-DIPHTH-ACELL PERTUSSIS 5-2.5-18.5 LF-MCG/0.5 IM SUSP: 0.5000 mL | Freq: Once | INTRAMUSCULAR | Status: DC

## 2012-07-04 MED ORDER — DIPHENHYDRAMINE HCL 25 MG PO CAPS: 25.0000 mg | ORAL_CAPSULE | ORAL | Status: DC | PRN

## 2012-07-04 MED ORDER — MIDAZOLAM HCL 5 MG/5ML IJ SOLN
INTRAMUSCULAR | Status: DC | PRN
  Administered 2012-07-04: 2 mg via INTRAVENOUS

## 2012-07-04 MED ORDER — PANTOPRAZOLE SODIUM 40 MG PO TBEC
40.0000 mg | DELAYED_RELEASE_TABLET | Freq: Once | ORAL | Status: AC
  Administered 2012-07-04: 40 mg via ORAL
  Filled 2012-07-04: qty 1

## 2012-07-04 MED ORDER — SODIUM CHLORIDE 0.9 % IV SOLN
1.0000 ug/kg/h | INTRAVENOUS | Status: DC | PRN
  Filled 2012-07-04: qty 2.5

## 2012-07-04 MED ORDER — ONDANSETRON HCL 4 MG/2ML IJ SOLN: 4.0000 mg | Freq: Three times a day (TID) | INTRAMUSCULAR | Status: DC | PRN

## 2012-07-04 MED ORDER — DIPHENHYDRAMINE HCL 50 MG/ML IJ SOLN: 12.5000 mg | INTRAMUSCULAR | Status: DC | PRN

## 2012-07-04 MED ORDER — WITCH HAZEL-GLYCERIN EX PADS: 1.0000 "application " | MEDICATED_PAD | CUTANEOUS | Status: DC | PRN

## 2012-07-04 MED ORDER — MORPHINE SULFATE 0.5 MG/ML IJ SOLN
INTRAMUSCULAR | Status: AC
  Filled 2012-07-04: qty 10

## 2012-07-04 MED ORDER — KETOROLAC TROMETHAMINE 30 MG/ML IJ SOLN
INTRAMUSCULAR | Status: AC
  Administered 2012-07-04: 30 mg via INTRAVENOUS
  Filled 2012-07-04: qty 1

## 2012-07-04 MED ORDER — PHENYLEPHRINE 40 MCG/ML (10ML) SYRINGE FOR IV PUSH (FOR BLOOD PRESSURE SUPPORT)
PREFILLED_SYRINGE | INTRAVENOUS | Status: AC
  Filled 2012-07-04: qty 5

## 2012-07-04 MED ORDER — ONDANSETRON HCL 4 MG/2ML IJ SOLN
4.0000 mg | INTRAMUSCULAR | Status: DC | PRN
  Administered 2012-07-04: 4 mg via INTRAVENOUS
  Filled 2012-07-04: qty 2

## 2012-07-04 MED ORDER — SIMETHICONE 80 MG PO CHEW
80.0000 mg | CHEWABLE_TABLET | ORAL | Status: DC | PRN
  Administered 2012-07-06: 80 mg via ORAL

## 2012-07-04 MED ORDER — LACTATED RINGERS IV SOLN
INTRAVENOUS | Status: DC
  Administered 2012-07-04: 125 mL/h via INTRAUTERINE

## 2012-07-04 MED ORDER — FLUTICASONE PROPIONATE 50 MCG/ACT NA SUSP
2.0000 | Freq: Every day | NASAL | Status: DC
  Administered 2012-07-05 – 2012-07-06 (×2): 2 via NASAL
  Filled 2012-07-04: qty 16

## 2012-07-04 MED ORDER — FENTANYL CITRATE 0.05 MG/ML IJ SOLN
INTRAMUSCULAR | Status: AC
  Administered 2012-07-04: 25 ug via INTRAVENOUS
  Filled 2012-07-04: qty 2

## 2012-07-04 MED ORDER — SCOPOLAMINE 1 MG/3DAYS TD PT72
1.0000 | MEDICATED_PATCH | Freq: Once | TRANSDERMAL | Status: DC
  Administered 2012-07-04: 1.5 mg via TRANSDERMAL

## 2012-07-04 MED ORDER — SODIUM BICARBONATE 8.4 % IV SOLN
INTRAVENOUS | Status: DC | PRN
  Administered 2012-07-04: 07:00:00 via EPIDURAL

## 2012-07-04 MED ORDER — KETOROLAC TROMETHAMINE 30 MG/ML IJ SOLN
30.0000 mg | Freq: Four times a day (QID) | INTRAMUSCULAR | Status: AC | PRN
  Administered 2012-07-04: 30 mg via INTRAVENOUS

## 2012-07-04 SURGICAL SUPPLY — 31 items
CLOTH BEACON ORANGE TIMEOUT ST (SAFETY) ×2 IMPLANT
DERMABOND ADVANCED (GAUZE/BANDAGES/DRESSINGS)
DERMABOND ADVANCED .7 DNX12 (GAUZE/BANDAGES/DRESSINGS) IMPLANT
DRAPE SURG 17X23 STRL (DRAPES) ×2 IMPLANT
DRSG COVADERM 4X10 (GAUZE/BANDAGES/DRESSINGS) ×2 IMPLANT
DURAPREP 26ML APPLICATOR (WOUND CARE) ×2 IMPLANT
ELECT REM PT RETURN 9FT ADLT (ELECTROSURGICAL) ×2
ELECTRODE REM PT RTRN 9FT ADLT (ELECTROSURGICAL) ×1 IMPLANT
EXTRACTOR VACUUM M CUP 4 TUBE (SUCTIONS) IMPLANT
GLOVE BIO SURGEON STRL SZ 6 (GLOVE) ×2 IMPLANT
GLOVE BIOGEL PI IND STRL 6 (GLOVE) ×2 IMPLANT
GLOVE BIOGEL PI INDICATOR 6 (GLOVE) ×2
GOWN PREVENTION PLUS LG XLONG (DISPOSABLE) ×6 IMPLANT
GOWN STRL REIN XL XLG (GOWN DISPOSABLE) ×2 IMPLANT
KIT ABG SYR 3ML LUER SLIP (SYRINGE) ×2 IMPLANT
NEEDLE HYPO 25X5/8 SAFETYGLIDE (NEEDLE) ×2 IMPLANT
NS IRRIG 1000ML POUR BTL (IV SOLUTION) ×2 IMPLANT
PACK C SECTION WH (CUSTOM PROCEDURE TRAY) ×2 IMPLANT
PAD ABD 7.5X8 STRL (GAUZE/BANDAGES/DRESSINGS) ×2 IMPLANT
PAD OB MATERNITY 4.3X12.25 (PERSONAL CARE ITEMS) IMPLANT
SLEEVE SCD COMPRESS KNEE MED (MISCELLANEOUS) IMPLANT
STAPLER VISISTAT 35W (STAPLE) IMPLANT
SUT CHROMIC 0 CTX 36 (SUTURE) ×6 IMPLANT
SUT PDS AB 0 CT1 27 (SUTURE) IMPLANT
SUT PLAIN 0 NONE (SUTURE) IMPLANT
SUT VIC AB 0 CT1 36 (SUTURE) ×6 IMPLANT
SUT VIC AB 4-0 KS 27 (SUTURE) IMPLANT
TAPE CLOTH SURG 4X10 WHT LF (GAUZE/BANDAGES/DRESSINGS) ×2 IMPLANT
TOWEL OR 17X24 6PK STRL BLUE (TOWEL DISPOSABLE) ×4 IMPLANT
TRAY FOLEY CATH 14FR (SET/KITS/TRAYS/PACK) IMPLANT
WATER STERILE IRR 1000ML POUR (IV SOLUTION) ×2 IMPLANT

## 2012-07-04 NOTE — Progress Notes (Signed)
Difficulty tracing contractions and variable decelerations noted.   IUPC and FSE placed without complication.   Will amnioinfuse as needed.    Mitchel Honour, DO

## 2012-07-04 NOTE — Anesthesia Postprocedure Evaluation (Signed)
  Anesthesia Post-op Note  Patient: Heidi Schmitt  Procedure(s) Performed: Procedure(s) (LRB) with comments: CESAREAN SECTION (N/A) - Primary cesarean section with delivery of baby boy at 0702. Apgars7/9.  Patient Location: Mother Baby  Anesthesia Type: Spinal  Level of Consciousness: awake, alert  and oriented  Airway and Oxygen Therapy: Patient Spontanous Breathing  Post-op Pain: none  Post-op Assessment: Post-op Vital signs reviewed and Patient's Cardiovascular Status Stable  Post-op Vital Signs: Reviewed and stable  Complications: No apparent anesthesia complications

## 2012-07-04 NOTE — Addendum Note (Signed)
Addendum  created 07/04/12 1608 by Gertie Fey, CRNA   Modules edited:Notes Section

## 2012-07-04 NOTE — Progress Notes (Signed)
Heidi Schmitt is a 31 y.o. G2P1001 at [redacted]w[redacted]d by ultrasound admitted for induction of labor due to Hypertension.    Subjective: Comfortable with epidural  Objective: BP 148/57  Pulse 74  Temp 97.8 F (36.6 C) (Oral)  Resp 20  Ht 5\' 6"  (1.676 m)  Wt 113.399 kg (250 lb)  BMI 40.35 kg/m2  SpO2 100%      FHT:  FHR: 135 bpm, variability: moderate,  accelerations:  Abscent,  decelerations:  Absent UC:   regular, every 3 minutes SVE:   Dilation: 1.5 Effacement (%): 60 Station: -2 Exam by:: Helane Gunther, rn SVE: 4/75/-2, membrane rupture occurred at an unknown time, clear fluid.  Labs: Lab Results  Component Value Date   WBC 9.9 07/04/2012   HGB 12.1 07/04/2012   HCT 36.4 07/04/2012   MCV 84.7 07/04/2012   PLT 172 07/04/2012    Assessment / Plan: Induction of labor due to severe GHTN, progressing well s/p VMP x 2 doses  Labor: Progressing normally Preeclampsia:  n/a Fetal Wellbeing:  Category II Pain Control:  Epidural I/D:  n/a Anticipated MOD:  NSVD  Heidi Schmitt 07/04/2012, 5:12 AM

## 2012-07-04 NOTE — Anesthesia Postprocedure Evaluation (Signed)
  Anesthesia Post-op Note  Patient: Heidi Schmitt  Procedure(s) Performed: Procedure(s) (LRB) with comments: CESAREAN SECTION (N/A) - Primary cesarean section with delivery of baby boy at 0702. Apgars7/9.  Patient Location: PACU  Anesthesia Type: Epidural  Level of Consciousness: awake, alert  and oriented  Airway and Oxygen Therapy: Patient Spontanous Breathing  Post-op Pain: none  Post-op Assessment: Post-op Vital signs reviewed, Patient's Cardiovascular Status Stable, Respiratory Function Stable, Patent Airway, No signs of Nausea or vomiting, Pain level controlled, No headache, No backache, No residual numbness and No residual motor weakness  Post-op Vital Signs: Reviewed and stable  Complications: No apparent anesthesia complications

## 2012-07-04 NOTE — Anesthesia Procedure Notes (Signed)
Epidural Patient location during procedure: OB Start time: 07/04/2012 3:54 AM  Staffing Anesthesiologist: Brayton Caves R Performed by: anesthesiologist   Preanesthetic Checklist Completed: patient identified, site marked, surgical consent, pre-op evaluation, timeout performed, IV checked, risks and benefits discussed and monitors and equipment checked  Epidural Patient position: sitting Prep: site prepped and draped and DuraPrep Patient monitoring: continuous pulse ox and blood pressure Approach: midline Injection technique: LOR air and LOR saline  Needle:  Needle type: Tuohy  Needle gauge: 17 G Needle length: 9 cm and 9 Needle insertion depth: 8 cm Catheter type: closed end flexible Catheter size: 19 Gauge Catheter at skin depth: 15 cm Test dose: negative  Assessment Events: blood not aspirated, injection not painful, no injection resistance, negative IV test and no paresthesia  Additional Notes Patient identified.  Risk benefits discussed including failed block, incomplete pain control, headache, nerve damage, paralysis, blood pressure changes, nausea, vomiting, reactions to medication both toxic or allergic, and postpartum back pain.  Patient expressed understanding and wished to proceed.  All questions were answered.  Sterile technique used throughout procedure and epidural site dressed with sterile barrier dressing. No paresthesia or other complications noted.The patient did not experience any signs of intravascular injection such as tinnitus or metallic taste in mouth nor signs of intrathecal spread such as rapid motor block. Please see nursing notes for vital signs.

## 2012-07-04 NOTE — Anesthesia Preprocedure Evaluation (Signed)
Anesthesia Evaluation  Patient identified by MRN, date of birth, ID band Patient awake    Reviewed: Allergy & Precautions, H&P , Patient's Chart, lab work & pertinent test results  Airway Mallampati: III TM Distance: >3 FB Neck ROM: full    Dental No notable dental hx.    Pulmonary neg pulmonary ROS,  breath sounds clear to auscultation  Pulmonary exam normal       Cardiovascular hypertension, negative cardio ROS  Rhythm:regular Rate:Normal     Neuro/Psych negative neurological ROS  negative psych ROS   GI/Hepatic negative GI ROS, Neg liver ROS,   Endo/Other  negative endocrine ROSdiabetesMorbid obesity  Renal/GU negative Renal ROS     Musculoskeletal   Abdominal   Peds  Hematology negative hematology ROS (+)   Anesthesia Other Findings Diabetes mellitus     Anxiety        Seasonal allergies     Pregnancy induced hypertension        H/O varicella     Gestational diabetes    Reproductive/Obstetrics (+) Pregnancy                           Anesthesia Physical Anesthesia Plan  ASA: III  Anesthesia Plan: Epidural   Post-op Pain Management:    Induction:   Airway Management Planned:   Additional Equipment:   Intra-op Plan:   Post-operative Plan:   Informed Consent: I have reviewed the patients History and Physical, chart, labs and discussed the procedure including the risks, benefits and alternatives for the proposed anesthesia with the patient or authorized representative who has indicated his/her understanding and acceptance.     Plan Discussed with:   Anesthesia Plan Comments:         Anesthesia Quick Evaluation

## 2012-07-04 NOTE — Brief Op Note (Signed)
07/03/2012 - 07/04/2012  7:37 AM  PATIENT:  Heidi Schmitt  31 y.o. female  PRE-OPERATIVE DIAGNOSIS:  Hypertension, Fetal intolerance to labor  POST-OPERATIVE DIAGNOSIS:  Same, Placental abruption  PROCEDURE:  Procedure(s) (LRB) with comments: CESAREAN SECTION (N/A) - Primary cesarean section with delivery of baby boy at 0702. Apgars7/9.  SURGEON:  Surgeon(s) and Role:    * Jeani Hawking, MD  PHYSICIAN ASSISTANT:   ASSISTANTS: none   ANESTHESIA:   epidural  EBL:  Total I/O In: -  Out: 200 [Urine:200]  BLOOD ADMINISTERED:none  DRAINS: Urinary Catheter (Foley)   LOCAL MEDICATIONS USED:  NONE  SPECIMEN:  No Specimen  DISPOSITION OF SPECIMEN:  N/A  COUNTS:  YES  TOURNIQUET:  * No tourniquets in log *  DICTATION: .Other Dictation: Dictation Number [id number]  PLAN OF CARE: Admit to inpatient   PATIENT DISPOSITION:  PACU - hemodynamically stable.   Delay start of Pharmacological VTE agent (>24hrs) due to surgical blood loss or risk of bleeding: not applicable

## 2012-07-04 NOTE — H&P (Signed)
Heidi Schmitt is a 31 y.o. female presenting for induction of labor.  Presented to office today with complaint of elevated BP and not feeling well.  BP in office 170/90 with brisk reflexes noted and negative protein on urine dip; otherwise normal exam.  No HA, CP/SOB, RUQ pain, visual disturbance. +FM.    Maternal Medical History:  Contractions: Onset was 1 week ago.   Frequency: irregular.   Perceived severity is mild.    Fetal activity: Perceived fetal activity is normal.   Last perceived fetal movement was within the past hour.    Prenatal complications: Hypertension.   Prenatal Complications - Diabetes: gestational. Diabetes is managed by diet.      OB History    Grav Para Term Preterm Abortions TAB SAB Ect Mult Living   2 1 1  0 0 0 0 0 0 1     Past Medical History  Diagnosis Date  . Diabetes mellitus   . Anxiety   . Seasonal allergies   . Pregnancy induced hypertension   . H/O varicella   . Gestational diabetes     diet controlled   Past Surgical History  Procedure Date  . Tonsillectomy    Family History: family history includes Alcohol abuse in her father; Birth defects in her other; Diabetes in her maternal aunt and maternal grandfather; Heart disease in her father and maternal grandfather; and Hypertension in her maternal aunt and maternal grandfather.  There is no history of Other. Social History:  reports that she quit smoking about 7 years ago. Her smoking use included Cigarettes. She has never used smokeless tobacco. She reports that she does not drink alcohol or use illicit drugs.   Prenatal Transfer Tool  Maternal Diabetes: Yes:  Diabetes Type:  Diet controlled Genetic Screening: Normal Maternal Ultrasounds/Referrals: Normal Fetal Ultrasounds or other Referrals:  None Maternal Substance Abuse:  No Significant Maternal Medications:  None Significant Maternal Lab Results:  None Other Comments:  None  ROS  Dilation: 1.5 Effacement (%): 60 Station:  -2 Exam by:: J. Daley, rn Blood pressure 157/86, pulse 76, temperature 97.8 F (36.6 C), temperature source Oral, resp. rate 20, height 5\' 6"  (1.676 m), weight 113.399 kg (250 lb), SpO2 100.00%. Maternal Exam:  Uterine Assessment: Contraction strength is moderate.  Contraction frequency is irregular.   Abdomen: Patient reports no abdominal tenderness. Fundal height is c/w dates.   Estimated fetal weight is 7#8.   Fetal presentation: vertex  Introitus: Normal vulva. Normal vagina.  Ferning test: not done.  Nitrazine test: not done.  Pelvis: adequate for delivery.   Cervix: Cervix evaluated by digital exam.     Physical Exam  Constitutional: She is oriented to person, place, and time. She appears well-developed and well-nourished.  GI: Soft. Bowel sounds are normal.  Genitourinary: Vagina normal and uterus normal.  Neurological: She is alert and oriented to person, place, and time.  Skin: Skin is warm and dry.    Prenatal labs: ABO, Rh: --/--/O POS, O POS (10/23 1820) Antibody: NEG (10/23 1820) Rubella: Immune (04/01 0000) RPR: NON REACTIVE (10/23 1820)  HBsAg: Negative (04/01 0000)  HIV: Non-reactive (04/01 0000)  GBS: Negative (10/10 0000)   Assessment/Plan: 30yo G2P1001 at [redacted]w[redacted]d for induction of labor secondary to severe GHTN. -Cytotec IOL -Epidural when desired -A1DM-Will get q 2 hr fsbs and tx for >120 -GBS neg   Heidi Schmitt 07/04/2012, 4:04 AM

## 2012-07-04 NOTE — Transfer of Care (Signed)
Immediate Anesthesia Transfer of Care Note  Patient: Heidi Schmitt  Procedure(s) Performed: Procedure(s) (LRB) with comments: CESAREAN SECTION (N/A) - Primary cesarean section with delivery of baby boy at 0702. Apgars7/9.  Patient Location: PACU  Anesthesia Type: Epidural  Level of Consciousness: awake, sedated and patient cooperative  Airway & Oxygen Therapy: Patient Spontanous Breathing and Patient connected to nasal cannula oxygen  Post-op Assessment: Report given to PACU RN and Post -op Vital signs reviewed and stable  Post vital signs: Reviewed and stable  Complications: No apparent anesthesia complications

## 2012-07-04 NOTE — OR Nursing (Signed)
Uterus massaged by S. Marletta Bousquet Charity fundraiser.  Two tubes of cord blood sent to lab. Foley catheter in upon arrival to OR.  50cc of blood evacuated from uterus during uterine massage.

## 2012-07-04 NOTE — Op Note (Signed)
Heidi Schmitt, Heidi Schmitt                ACCOUNT NO.:  [id number]  MEDICAL RECORD NO.:  [id number]  LOCATION:  9129                          FACILITY:  [facility]  PHYSICIAN:  Laakea Pereira L. Trenesha Alcaide, M.D.DATE OF BIRTH:  1980-11-20  DATE OF PROCEDURE:  07/04/2012 DATE OF DISCHARGE:                              OPERATIVE REPORT   PREOPERATIVE DIAGNOSIS:  Nonreassuring fetal heart rate and fetal intolerance of labor and gestational hypertension.  POSTOPERATIVE DIAGNOSIS:  Nonreassuring fetal heart rate and fetal intolerance of labor and gestational hypertension and placental abruption.  SURGEON:  Javione Gunawan L. Vincente Poli, MD  PROCEDURE:  Primary low transverse cesarean section.  ANESTHESIA:  Epidural.  EBL:  Less than 500.  PROCEDURE:  This is a patient who was admitted last night by Dr. Langston Masker for an induction secondary to hypertension.  She received Cytotec and throughout the night there was some development of decelerations.  IUPC was placed and amnio infusion was performed.  She then developed a nonreassuring fetal heart rate tracing.  Taken to the OR for cesarean section.  At that point, Dr. Langston Masker had another patient about to deliver vaginally and I was not on-call until 7:30 but she called me and I arrived rather quickly shortly after the patient arrived to the OR to perform the C-section.  When I walked in to the operating room, the heart rate was 73.  We immediately had draped the patient and a low transverse incision was made, carried down to the fascia.  Fascia was scored to the midline, extended laterally, and the rectus muscles were separated in the midline and divided.  The peritoneum was entered sharply.  The peritoneal incision was then extended.  The bladder blade was inserted.  The lower uterine segment was identified.  The bladder flap was created sharply and then digitally.  The bladder blade was then readjusted.  A low transverse incision was made in the uterus.  Copious amount  of port wine colored amniotic fluid was noted consistent with a placental abruption.  The baby was in cephalic presentation, was delivered easily with a double nuchal cord.  The cord was clamped and cut.  The baby was handed to the awaiting pediatricians.  Apgars were subsequently 7 and 9.  The uterus was exteriorized.  The placenta was manually removed, noted to be consistent with a placental abruption.  It was intact with 3-vessel cord.  The uterine incision was closed in 2 layers using 0 chromic in a running locked stitch.  Uterus was returned to the abdomen.  Irrigation performed.  Hemostasis was noted.  The peritoneum was closed using 0 Vicryl and the fascia was closed using 0 Vicryl, starting each corner, meeting in the midline. After irrigation of subcutaneous layer, the skin was closed with staples.  All sponge, lap, and instrument counts were correct x2.  The patient went to recovery room in stable condition.     Fiorella Hanahan L. Vincente Poli, M.D.     Florestine Avers  D:  07/04/2012  T:  07/04/2012  Job:  [id number]

## 2012-07-04 NOTE — Progress Notes (Signed)
Repetitive variable decelerations unresponsive to repositioning and amnioinfusion.  SVE: 6/90/0.   Patient counseled for C/S secondary to fetal intolerance of labor.  Informed of risk of bleeding, possibly requiring blood transfusion, life-saving c-hyst.  Informed of risk of damage to surrounding structures including bowel, bladder and fetus. Informed of risk of infection and effects in future pregnancies.  All questions were answered and the patient wishes to proceed.  Brethine to be given.    Mitchel Honour, DO

## 2012-07-05 ENCOUNTER — Encounter (HOSPITAL_COMMUNITY): Payer: Self-pay | Admitting: Obstetrics & Gynecology

## 2012-07-05 LAB — CBC
HCT: 32.3 % — ABNORMAL LOW (ref 36.0–46.0)
Hemoglobin: 10.7 g/dL — ABNORMAL LOW (ref 12.0–15.0)
MCH: 28.6 pg (ref 26.0–34.0)
MCHC: 33.1 g/dL (ref 30.0–36.0)
MCV: 86.4 fL (ref 78.0–100.0)
Platelets: 150 10*3/uL (ref 150–400)
RBC: 3.74 MIL/uL — ABNORMAL LOW (ref 3.87–5.11)
RDW: 13.6 % (ref 11.5–15.5)
WBC: 11.7 10*3/uL — ABNORMAL HIGH (ref 4.0–10.5)

## 2012-07-05 MED ORDER — MORPHINE SULFATE 0.5 MG/ML IJ SOLN
INTRAMUSCULAR | Status: AC
  Filled 2012-07-05: qty 10

## 2012-07-05 MED ORDER — FENTANYL CITRATE 0.05 MG/ML IJ SOLN
INTRAMUSCULAR | Status: AC
  Filled 2012-07-05: qty 2

## 2012-07-05 MED ORDER — PANTOPRAZOLE SODIUM 40 MG PO TBEC
40.0000 mg | DELAYED_RELEASE_TABLET | Freq: Two times a day (BID) | ORAL | Status: DC
  Administered 2012-07-05 – 2012-07-06 (×3): 40 mg via ORAL
  Filled 2012-07-05 (×3): qty 1

## 2012-07-05 NOTE — Progress Notes (Signed)
Subjective: Postpartum Day 1: Cesarean Delivery Patient reports tolerating PO and no problems voiding.    Objective: Vital signs in last 24 hours: Temp:  [97.3 F (36.3 C)-98.6 F (37 C)] 97.3 F (36.3 C) (10/25 1610) Pulse Rate:  [65-79] 67  (10/25 0638) Resp:  [18-24] 18  (10/25 0638) BP: (104-157)/(54-77) 134/74 mmHg (10/25 0638) SpO2:  [95 %-100 %] 95 % (10/25 0500)  Physical Exam:  General: alert and cooperative Lochia: appropriate Uterine Fundus: firm Incision: healing well DVT Evaluation: No evidence of DVT seen on physical exam. No significant calf/ankle edema.   Basename 07/05/12 0510 07/04/12 0914  HGB 10.7* 11.4*  HCT 32.3* 33.4*    Assessment/Plan: Status post Cesarean section. Doing well postoperatively.  Continue current care.  Maxie Debose G 07/05/2012, 8:07 AM

## 2012-07-05 NOTE — Clinical Social Work Psychosocial (Signed)
Clinical Social Work Department  BRIEF PSYCHOSOCIAL ASSESSMENT  07/05/2012  Patient: Heidi Schmitt,Heidi Schmitt Account Number: [id number] Admit date: 07/03/2012  Clinical Social Worker: Mertie Haslem, LCSWA Date/Time: 07/05/2012 02:13 PM  Referred by: Physician Date Referred: 07/05/2012  Referred for   Behavioral Health Issues   Other Referral:  Hx of anxiety   Interview type: Patient  Other interview type:  PSYCHOSOCIAL DATA  Living Status: HUSBAND  Admitted from facility:  Level of care:  Primary support name: Heidi Schmitt  Primary support relationship to patient: SPOUSE  Degree of support available:  Involved   CURRENT CONCERNS  Current Concerns   Behavioral Health Issues   Other Concerns:  SOCIAL WORK ASSESSMENT / PLAN  Sw referral received to assess pt's history of anxiety. Pt admits to feeling anxious majority of the time and told Sw that she has "control issues." She was prescribed Lexapro 5mg, of which she reports being helpful. She stopped taking the medication, 9 months prior to conception. Pt identified her job & family, as source of anxiety. She denies any depression. Pt told Sw that she coped as well as she could without the medication during the pregnancy. Since she is nursing, she does not plan to restart medication right away but agrees to contact her medical provider if symptoms become unmanageable. Pt's spouse is supportive, as per pt. She has all the necessary supplies and good family support. Pt appears to be appropriate and self aware. Sw discussed PP depression symptoms briefly. Sw available to assist further if needed.   Assessment/plan status: No Further Intervention Required  Other assessment/ plan:  Information/referral to community resources:  Pt agrees to follow up with medical provider if needed.   PATIENT'S/FAMILY'S RESPONSE TO PLAN OF CARE:  Pt spoke openly with this Sw and thanked services.        Clinical Social Work Department BRIEF PSYCHOSOCIAL ASSESSMENT 07/05/2012  Patient:  Heidi Schmitt, Heidi Schmitt     Account Number:  [id number]     Admit date:  07/03/2012  Clinical Social Worker:  Andy Gauss  Date/Time:  07/05/2012 02:13 PM  Referred by:  Physician  Date Referred:  07/05/2012 Referred for  Behavioral Health Issues   Other Referral:   Hx of anxiety   Interview type:  Patient Other interview type:    PSYCHOSOCIAL DATA Living Status:  HUSBAND Admitted from facility:   Level of care:   Primary support name:  Heidi Schmitt Primary support relationship to patient:  SPOUSE Degree of support available:   Involved    CURRENT CONCERNS Current Concerns  Behavioral Health Issues   Other Concerns:    SOCIAL WORK ASSESSMENT / PLAN Sw referral received to assess pt's history of anxiety.  Pt admits to feeling anxious majority of the time and told Sw that she has "control issues."  She was prescribed Lexapro 5mg , of which she reports being helpful.  She stopped taking the medication, 9 months prior to conception.  Pt identified her job & family, as source of anxiety.  She denies any depression.  Pt told Sw that she coped as well as she could without the medication during the pregnancy. Since she is nursing, she does not plan to restart medication right away but agrees to contact her medical provider if symptoms become unmanageable.  Pt's spouse is supportive, as per pt.  She has all the necessary supplies and good family support.  Pt appears to be appropriate and self aware.  Sw discussed PP depression symptoms briefly. Sw available to assist further if needed.   Assessment/plan status:  No Further Intervention Required Other assessment/ plan:   Information/referral to community resources:   Pt agrees to follow up with medical provider if needed.    PATIENT'S/FAMILY'S RESPONSE TO PLAN OF CARE: Pt spoke openly with this Sw and thanked services.

## 2012-07-05 NOTE — Progress Notes (Signed)
UR chart review completed.  

## 2012-07-05 NOTE — Progress Notes (Signed)
Agree w/ above note R/b/a of baby circumcision d/w pt.  Informed consent

## 2012-07-06 MED ORDER — IBUPROFEN 600 MG PO TABS: 600.0000 mg | ORAL_TABLET | Freq: Four times a day (QID) | ORAL | Status: DC

## 2012-07-06 MED ORDER — OXYCODONE-ACETAMINOPHEN 5-325 MG PO TABS: 1.0000 | ORAL_TABLET | ORAL | Status: DC | PRN

## 2012-07-06 NOTE — Discharge Summary (Signed)
Obstetric Discharge Summary Reason for Admission: induction of labor Prenatal Procedures: ultrasound Intrapartum Procedures: cesarean: low cervical, transverse Postpartum Procedures: none Complications-Operative and Postpartum: none Hemoglobin  Date Value Range Status  07/05/2012 10.7* 12.0 - 15.0 g/dL Final  09/16/1094 04.5   Final     HCT  Date Value Range Status  07/05/2012 32.3* 36.0 - 46.0 % Final  12/11/2011 43   Final    Physical Exam:  General: alert and cooperative Lochia: appropriate Uterine Fundus: firm Incision: healing well, no significant drainage DVT Evaluation: No evidence of DVT seen on physical exam.  Discharge Diagnoses: Term Pregnancy-delivered  Discharge Information: Date: 07/06/2012 Activity: pelvic rest Diet: routine Medications: PNV, Ibuprofen and Percocet Condition: stable Instructions: refer to practice specific booklet Discharge to: home Follow-up Information    In 2 days to follow up.         Newborn Data: Live born female  Birth Weight: 7 lb 5.6 oz (3335 g) APGAR: 7, 9  Home with mother.  Heidi Schmitt 07/06/2012, 9:50 AM

## 2012-07-06 NOTE — Progress Notes (Signed)
Subjective: Postpartum Day 2: Cesarean Delivery Patient reports tolerating PO, + flatus and no problems voiding.  Desires discharge  Objective: Vital signs in last 24 hours: Temp:  [97.6 F (36.4 C)-97.8 F (36.6 C)] 97.7 F (36.5 C) (10/26 0535) Pulse Rate:  [71-76] 76  (10/26 0535) Resp:  [18-20] 20  (10/26 0535) BP: (126-155)/(78-85) 155/85 mmHg (10/26 0535)  Physical Exam:  General: alert and cooperative Lochia: appropriate Uterine Fundus: firm Incision: healing well, no significant drainage DVT Evaluation: No evidence of DVT seen on physical exam.   Basename 07/05/12 0510 07/04/12 0914  HGB 10.7* 11.4*  HCT 32.3* 33.4*    Assessment/Plan: Status post Cesarean section. Doing well postoperatively.  Continue current care.  Heidi Schmitt 07/06/2012, 9:47 AM

## 2014-07-13 ENCOUNTER — Encounter (HOSPITAL_COMMUNITY): Payer: Self-pay | Admitting: Obstetrics & Gynecology

## 2014-10-22 ENCOUNTER — Other Ambulatory Visit: Payer: Self-pay | Admitting: Obstetrics and Gynecology

## 2014-10-23 LAB — CYTOLOGY - PAP

## 2015-05-13 HISTORY — PX: SEPTOPLASTY: SUR1290

## 2015-08-12 HISTORY — PX: CHOLECYSTECTOMY: SHX55

## 2015-09-03 ENCOUNTER — Other Ambulatory Visit: Payer: Self-pay

## 2015-10-04 MED FILL — VIT D2 1.25 MG (50,000 UNIT: 1.25 MG | 28 days supply | Qty: 4 | Fill #0

## 2015-10-04 MED FILL — LOSARTAN POTASSIUM 50 MG TA: 50 | 30 days supply | Qty: 30 | Fill #0

## 2015-10-04 MED FILL — OMEPRAZOLE DR 40 MG CAPSULE: 40 | 30 days supply | Qty: 60 | Fill #0

## 2015-11-22 MED FILL — EPIPEN 2-PAK 0.3 MG AUTO-IN: 0.3 | 30 days supply | Qty: 2 | Fill #0

## 2015-11-22 MED FILL — LOSARTAN POTASSIUM 50 MG TA: 50 | 30 days supply | Qty: 30 | Fill #1

## 2015-11-22 MED FILL — OMEPRAZOLE DR 40 MG CAPSULE: 40 | 30 days supply | Qty: 60 | Fill #1

## 2015-12-27 MED FILL — LOSARTAN POTASSIUM 50 MG TA: 50 | 30 days supply | Qty: 30 | Fill #2

## 2015-12-27 MED FILL — OMEPRAZOLE DR 40 MG CAPSULE: 40 | 30 days supply | Qty: 60 | Fill #2

## 2016-01-18 MED FILL — AMOX TR-K CLV 875-125 MG TA: 875-125 | 10 days supply | Qty: 20 | Fill #0

## 2016-01-19 MED FILL — CIPRODEX OTIC SUSPENSION: 0.3-0.1 | 5 days supply | Qty: 8 | Fill #0

## 2016-01-25 MED FILL — LOSARTAN POTASSIUM 50 MG TA: 50 | 30 days supply | Qty: 30 | Fill #0

## 2016-02-24 MED FILL — LOSARTAN POTASSIUM 50 MG TA: 50 | 30 days supply | Qty: 30 | Fill #1

## 2016-02-28 MED FILL — CELECOXIB 200 MG CAPSULE: 200 | 30 days supply | Qty: 30 | Fill #0

## 2016-03-20 MED FILL — LOSARTAN POTASSIUM 50 MG TA: 50 | 30 days supply | Qty: 30 | Fill #2

## 2016-04-06 MED FILL — CELECOXIB 200 MG CAPSULE: 200 | 30 days supply | Qty: 30 | Fill #1

## 2016-04-06 MED FILL — OMEPRAZOLE DR 20 MG CAPSULE: 20 | 30 days supply | Qty: 30 | Fill #0

## 2016-04-07 MED FILL — KETOCONAZOLE 2% CREAM: 2 | 10 days supply | Qty: 15 | Fill #0

## 2016-04-24 MED FILL — LOSARTAN POTASSIUM 50 MG TA: 50 | 30 days supply | Qty: 30 | Fill #3

## 2016-05-02 MED FILL — OMEPRAZOLE DR 20 MG CAPSULE: 20 | 30 days supply | Qty: 30 | Fill #1

## 2016-05-02 MED FILL — CELECOXIB 200 MG CAPSULE: 200 | 30 days supply | Qty: 45 | Fill #0

## 2016-05-18 MED FILL — LOSARTAN POTASSIUM 50 MG TA: 50 | 30 days supply | Qty: 30 | Fill #4

## 2016-06-06 MED FILL — MONTELUKAST SOD 10 MG TAB: 10 | 30 days supply | Qty: 30 | Fill #0

## 2016-06-06 MED FILL — CELECOXIB 200 MG CAPSULE: 200 | 30 days supply | Qty: 45 | Fill #1

## 2016-06-06 MED FILL — OMEPRAZOLE DR 20 MG CAPSULE: 20 | 30 days supply | Qty: 30 | Fill #2

## 2016-06-28 MED FILL — LOSARTAN POTASSIUM 50 MG TA: 50 | 30 days supply | Qty: 30 | Fill #5

## 2016-07-11 MED FILL — OMEPRAZOLE DR 20 MG CAPSULE: 20 | 30 days supply | Qty: 30 | Fill #3

## 2016-07-25 MED FILL — LOSARTAN POTASSIUM 50 MG TA: 50 | 30 days supply | Qty: 30 | Fill #0

## 2016-07-25 MED FILL — traZODone HCL 50 MG TABS: 50 | 30 days supply | Qty: 30 | Fill #0

## 2016-07-27 MED FILL — FLUoxetine HCL 10 MG CAPS: 10 | 30 days supply | Qty: 30 | Fill #0

## 2016-08-02 MED FILL — OMEPRAZOLE DR 20 MG CAPSULE: 20 | 30 days supply | Qty: 30 | Fill #0

## 2016-08-14 MED FILL — FLUoxetine HCL 20 MG CAPS: 20 | 30 days supply | Qty: 30 | Fill #0

## 2016-08-25 MED FILL — OMEPRAZOLE DR 20 MG CAPSULE: 20 | 30 days supply | Qty: 30 | Fill #1

## 2016-08-25 MED FILL — traZODone HCL 50 MG TABS: 50 | 30 days supply | Qty: 30 | Fill #1

## 2016-08-25 MED FILL — LOSARTAN POTASSIUM 50 MG TA: 50 | 30 days supply | Qty: 30 | Fill #1

## 2016-09-05 MED FILL — FLUoxetine HCL 20 MG CAPS: 20 | 30 days supply | Qty: 30 | Fill #1

## 2016-09-13 MED FILL — LOSARTAN POTASSIUM 50 MG TA: 50 | 30 days supply | Qty: 30 | Fill #0

## 2016-09-26 MED FILL — CIPRODEX OTIC SUSPENSION: 0.3-0.1 | 19 days supply | Qty: 8 | Fill #0

## 2016-10-03 MED FILL — FLUTICASONE PROP 50 MCG SPR: 50 | 30 days supply | Qty: 16 | Fill #0

## 2016-10-05 MED FILL — traZODone HCL 50 MG TABS: 50 | 30 days supply | Qty: 30 | Fill #2

## 2016-10-05 MED FILL — FLUoxetine HCL 20 MG CAPS: 20 | 30 days supply | Qty: 30 | Fill #2

## 2016-10-05 MED FILL — OMEPRAZOLE DR 20 MG CAPSULE: 20 | 30 days supply | Qty: 30 | Fill #2

## 2016-10-05 MED FILL — LOSARTAN POTASSIUM 50 MG TA: 50 | 30 days supply | Qty: 30 | Fill #1

## 2016-10-31 MED FILL — MONO-LINYAH 28 TABLET: 0.25-35 | 28 days supply | Qty: 28 | Fill #0

## 2016-10-31 MED FILL — OMEPRAZOLE DR 20 MG CAPSULE: 20 | 30 days supply | Qty: 30 | Fill #3

## 2016-10-31 MED FILL — FLUoxetine HCL 20 MG CAPS: 20 | 30 days supply | Qty: 30 | Fill #3

## 2016-10-31 MED FILL — LOSARTAN POTASSIUM 50 MG TA: 50 | 30 days supply | Qty: 30 | Fill #2

## 2016-10-31 MED FILL — traZODone HCL 50 MG TABS: 50 | 30 days supply | Qty: 30 | Fill #3

## 2016-10-31 MED FILL — FLUTICASONE PROP 50 MCG SPR: 50 | 30 days supply | Qty: 16 | Fill #1

## 2016-11-21 MED FILL — MONTELUKAST SOD 10 MG TAB: 10 | 30 days supply | Qty: 30 | Fill #1

## 2016-11-23 MED FILL — EPINEPHRINE 0.3 MG AUTO-INJ: 0.3 | 2 days supply | Qty: 2 | Fill #0

## 2016-11-27 MED FILL — OMEPRAZOLE DR 20 MG CAPSULE: 20 | 30 days supply | Qty: 30 | Fill #4

## 2016-11-27 MED FILL — LOSARTAN POTASSIUM 50 MG TA: 50 | 30 days supply | Qty: 30 | Fill #3

## 2016-11-27 MED FILL — FLUoxetine HCL 20 MG CAPS: 20 | 30 days supply | Qty: 30 | Fill #4

## 2016-11-27 MED FILL — MONO-LINYAH 28 TABLET: 0.25-35 | 28 days supply | Qty: 28 | Fill #1

## 2016-11-27 MED FILL — traZODone HCL 50 MG TABS: 50 | 30 days supply | Qty: 30 | Fill #4

## 2016-12-25 MED FILL — MONTELUKAST SOD 10 MG TAB: 10 | 30 days supply | Qty: 30 | Fill #2

## 2016-12-25 MED FILL — FLUoxetine HCL 20 MG CAPS: 20 | 30 days supply | Qty: 30 | Fill #5

## 2016-12-25 MED FILL — MONO-LINYAH 28 TABLET: 0.25-35 | 28 days supply | Qty: 28 | Fill #2

## 2016-12-25 MED FILL — LOSARTAN POTASSIUM 50 MG TA: 50 | 30 days supply | Qty: 30 | Fill #4

## 2016-12-25 MED FILL — traZODone HCL 50 MG TABS: 50 | 30 days supply | Qty: 30 | Fill #5

## 2016-12-29 MED FILL — OMEPRAZOLE DR 20 MG CAPSULE: 20 | 30 days supply | Qty: 30 | Fill #5

## 2017-01-25 MED FILL — LOSARTAN POTASSIUM 50 MG TA: 50 | 30 days supply | Qty: 30 | Fill #5

## 2017-01-25 MED FILL — OMEPRAZOLE DR 20 MG CAPSULE: 20 | 30 days supply | Qty: 30 | Fill #6

## 2017-01-25 MED FILL — traZODone HCL 50 MG TABS: 50 | 30 days supply | Qty: 30 | Fill #6

## 2017-01-25 MED FILL — MONO-LINYAH 28 TABLET: 0.25-35 | 28 days supply | Qty: 28 | Fill #3

## 2017-01-25 MED FILL — FLUoxetine HCL 20 MG CAPS: 20 | 30 days supply | Qty: 30 | Fill #6

## 2017-01-30 MED FILL — MONTELUKAST SOD 10 MG TAB: 10 | 30 days supply | Qty: 30 | Fill #0

## 2017-02-26 MED FILL — traZODone HCL 50 MG TABS: 50 | 30 days supply | Qty: 30 | Fill #7

## 2017-02-26 MED FILL — LOSARTAN POTASSIUM 50 MG TA: 50 | 30 days supply | Qty: 30 | Fill #6

## 2017-02-26 MED FILL — MONTELUKAST SOD 10 MG TAB: 10 | 30 days supply | Qty: 30 | Fill #1

## 2017-02-26 MED FILL — MONO-LINYAH 28 TABLET: 0.25-35 | 28 days supply | Qty: 28 | Fill #4

## 2017-02-26 MED FILL — FLUoxetine HCL 20 MG CAPS: 20 | 30 days supply | Qty: 30 | Fill #7

## 2017-02-26 MED FILL — OMEPRAZOLE DR 20 MG CAPSULE: 20 | 30 days supply | Qty: 30 | Fill #7

## 2017-03-26 MED FILL — FLUoxetine HCL 20 MG CAPS: 20 | 30 days supply | Qty: 30 | Fill #8

## 2017-03-26 MED FILL — MONTELUKAST SOD 10 MG TAB: 10 | 30 days supply | Qty: 30 | Fill #2

## 2017-03-26 MED FILL — traZODone HCL 50 MG TABS: 50 | 30 days supply | Qty: 30 | Fill #8

## 2017-03-26 MED FILL — OMEPRAZOLE DR 20 MG CAPSULE: 20 | 30 days supply | Qty: 30 | Fill #8

## 2017-03-26 MED FILL — MONO-LINYAH 28 TABLET: 0.25-35 | 28 days supply | Qty: 28 | Fill #5

## 2017-03-26 MED FILL — LOSARTAN POTASSIUM 50 MG TA: 50 | 30 days supply | Qty: 30 | Fill #7

## 2017-04-19 MED FILL — FLUoxetine HCL 20 MG CAPS: 20 | 30 days supply | Qty: 30 | Fill #9

## 2017-04-19 MED FILL — OMEPRAZOLE DR 20 MG CAPSULE: 20 | 30 days supply | Qty: 30 | Fill #9

## 2017-04-19 MED FILL — traZODone HCL 50 MG TABS: 50 | 30 days supply | Qty: 60 | Fill #0

## 2017-04-19 MED FILL — LOSARTAN POTASSIUM 50 MG TA: 50 | 30 days supply | Qty: 30 | Fill #8

## 2017-04-19 MED FILL — MONTELUKAST SOD 10 MG TAB: 10 | 30 days supply | Qty: 30 | Fill #3

## 2017-04-19 MED FILL — MONO-LINYAH 28 TABLET: 0.25-35 | 28 days supply | Qty: 28 | Fill #6

## 2017-05-16 MED FILL — NORG-ETHIN ESTRA 0.25-0.035: 0.25-35 | 28 days supply | Qty: 28 | Fill #0

## 2017-05-16 MED FILL — MONTELUKAST SOD 10 MG TAB: 10 | 30 days supply | Qty: 30 | Fill #0

## 2017-05-16 MED FILL — traZODone HCL 50 MG TABS: 50 | 30 days supply | Qty: 60 | Fill #1

## 2017-05-16 MED FILL — LOSARTAN POTASSIUM 50 MG TA: 50 | 30 days supply | Qty: 30 | Fill #9

## 2017-05-16 MED FILL — FLUoxetine HCL 20 MG CAPS: 20 | 30 days supply | Qty: 30 | Fill #10

## 2017-05-16 MED FILL — OMEPRAZOLE 20 MG CAP: 20 | 30 days supply | Qty: 30 | Fill #10

## 2017-06-06 MED FILL — CIPRODEX OTIC SUSPENSION: 0.3-0.1 | 9 days supply | Qty: 8 | Fill #0

## 2017-06-06 MED FILL — FLUTICASONE PROP 50 MCG SPR: 50 | 30 days supply | Qty: 16 | Fill #0

## 2017-06-21 MED FILL — FLUoxetine HCL 20 MG CAPS: 20 | 30 days supply | Qty: 30 | Fill #11

## 2017-06-21 MED FILL — MONTELUKAST SOD 10 MG TAB: 10 | 30 days supply | Qty: 30 | Fill #1

## 2017-06-21 MED FILL — LOSARTAN POTASSIUM 50 MG TA: 50 | 30 days supply | Qty: 30 | Fill #10

## 2017-06-21 MED FILL — OMEPRAZOLE 20 MG CAP: 20 | 30 days supply | Qty: 30 | Fill #11

## 2017-06-21 MED FILL — NORG-ETHIN ESTRA 0.25-0.035: 0.25-35 | 28 days supply | Qty: 28 | Fill #1

## 2017-06-21 MED FILL — traZODone HCL 50 MG TABS: 50 | 30 days supply | Qty: 60 | Fill #2

## 2017-07-16 MED FILL — MONTELUKAST SOD 10 MG TAB: 10 | 30 days supply | Qty: 30 | Fill #2

## 2017-07-16 MED FILL — traZODone HCL 50 MG TABS: 50 | 30 days supply | Qty: 60 | Fill #3

## 2017-07-16 MED FILL — LOSARTAN POTASSIUM 50 MG TA: 50 | 30 days supply | Qty: 30 | Fill #11

## 2017-07-16 MED FILL — NORG-ETHIN ESTRA 0.25-0.035: 0.25-35 | 28 days supply | Qty: 28 | Fill #2

## 2017-07-18 MED FILL — FLUoxetine HCL 20 MG CAPS: 20 | 30 days supply | Qty: 30 | Fill #0

## 2017-07-18 MED FILL — OMEPRAZOLE 20 MG CAP: 20 | 30 days supply | Qty: 30 | Fill #0

## 2017-07-24 ENCOUNTER — Encounter: Payer: Self-pay | Admitting: Podiatry

## 2017-07-24 ENCOUNTER — Ambulatory Visit: Payer: Medicaid Other | Admitting: Podiatry

## 2017-07-24 VITALS — BP 115/69 | HR 74

## 2017-07-24 DIAGNOSIS — B353 Tinea pedis: Secondary | ICD-10-CM

## 2017-07-24 DIAGNOSIS — M419 Scoliosis, unspecified: Secondary | ICD-10-CM | POA: Diagnosis not present

## 2017-07-24 DIAGNOSIS — B369 Superficial mycosis, unspecified: Secondary | ICD-10-CM

## 2017-07-24 DIAGNOSIS — M2170 Unequal limb length (acquired), unspecified site: Secondary | ICD-10-CM

## 2017-07-24 MED ORDER — FLUCONAZOLE 150 MG PO TABS: 150.0000 mg | ORAL_TABLET | ORAL | 1 refills | Status: DC

## 2017-07-24 MED ORDER — CLOTRIMAZOLE-BETAMETHASONE 1-0.05 % EX CREA: TOPICAL_CREAM | CUTANEOUS | 0 refills | Status: DC

## 2017-07-24 MED FILL — FLUCONAZOLE 150 MG TABLET: 150 | 28 days supply | Qty: 4 | Fill #0

## 2017-07-24 MED FILL — CLOTRIMAZOLE-BETAMETHASONE: 1-0.05 | 15 days supply | Qty: 30 | Fill #0

## 2017-07-24 NOTE — Patient Instructions (Signed)
Athlete's Foot Athlete's foot (tinea pedis) is a fungal infection of the skin on the feet. It often occurs on the skin that is between or underneath the toes. It can also occur on the soles of the feet. The infection can spread from person to person (is contagious). Follow these instructions at home:  Apply or take over-the-counter and prescription medicines only as told by your doctor.  Keep all follow-up visits as told by your doctor. This is important.  Do not scratch your feet.  Keep your feet dry: ? Wear cotton or wool socks. Change your socks every day or if they become wet. ? Wear shoes that allow air to move around, such as sandals or canvas tennis shoes.  Wash and dry your feet: ? Every day or as told by your doctor. ? After exercising. ? Including the area between your toes.  Wear sandals in wet areas, such as locker rooms and shared showers.  Do not share any of these items: ? Towels. ? Nail clippers. ? Other personal items that touch your feet.  If you have diabetes, keep your blood sugar under control. Contact a doctor if:  You have a fever.  You have swelling, soreness, warmth, or redness in your foot.  You are not getting better with treatment.  Your symptoms get worse.  You have new symptoms. This information is not intended to replace advice given to you by your health care provider. Make sure you discuss any questions you have with your health care provider. Document Released: 02/14/2008 Document Revised: 02/03/2016 Document Reviewed: 03/01/2015 Elsevier Interactive Patient Education  2018 [employer] Inc[employer]  

## 2017-07-24 NOTE — Progress Notes (Signed)
Subjective:    Patient ID: Heidi Schmitt, female    DOB: 1980-12-23, 36 y.o.   MRN: [id number] Chief Complaint  Patient presents with  . Tinea Pedis    has had athletes foot off and on for 8 plus years some bouts worse than others right now peeling cracking bleeding burning and itching on both feet     HPI 36 y.o. female presents with the above complaint.  She had plantar fasciitis for about 8 years has tried multiple over-the-counter therapies without relief desires stronger solution.  States that the areas crack and peel on bleed.  Reports itching and burning to the bottom of both feet  Still complains of history of scoliosis which causes her low back issues.  Believes that she has a normal discrepancy and wishes to obtain a heel lift  Past Medical History:  Diagnosis Date  . Anxiety   . Diabetes mellitus   . Gestational diabetes    diet controlled  . H/O varicella   . Pregnancy induced hypertension   . Seasonal allergies    Past Surgical History:  Procedure Laterality Date  . CESAREAN SECTION  07/04/2012   Procedure: CESAREAN SECTION;  Surgeon: Linda Hedges, DO;  Location: [facility];  Service: Obstetrics;  Laterality: N/A;  Primary cesarean section with delivery of baby boy at 0702. Apgars7/9.  . TONSILLECTOMY      Current Outpatient Medications:  .  cetirizine (ZYRTEC) 10 MG tablet, Take 10 mg by mouth daily., Disp: , Rfl:  .  Cholecalciferol (VITAMIN D3) 5000 units TABS, Take by mouth., Disp: , Rfl:  .  ciclopirox (LOPROX) 0.77 % cream, APPLY TO FEET TWICE PER DAY FOR 14 DAYS, Disp: , Rfl:  .  DOCOSAHEXAENOIC ACID PO, Take 1 g by mouth., Disp: , Rfl:  .  EPINEPHrine 0.3 mg/0.3 mL IJ SOAJ injection, Use as directed for life threatening allergic reaction. Please use [employer] generic or brand, Disp: , Rfl:  .  FLUoxetine (PROZAC) 20 MG capsule, Take 20 mg daily by mouth., Disp: , Rfl: 0 .  fluticasone (FLONASE) 50 MCG/ACT nasal spray, Place 2 sprays into the nose daily., Disp: ,  Rfl:  .  ibuprofen (ADVIL,MOTRIN) 600 MG tablet, Take 1 tablet (600 mg total) by mouth every 6 (six) hours., Disp: 30 tablet, Rfl: 2 .  levonorgestrel (MIRENA) 20 MCG/24HR IUD, by Intrauterine route., Disp: , Rfl:  .  losartan (COZAAR) 50 MG tablet, Take 50 mg by mouth., Disp: , Rfl:  .  montelukast (SINGULAIR) 10 MG tablet, Take 10 mg by mouth at bedtime as needed. For allergies, Disp: , Rfl:  .  norgestimate-ethinyl estradiol (ORTHO-CYCLEN,SPRINTEC,PREVIFEM) 0.25-35 MG-MCG tablet, Take 1 tablet daily by mouth. as directed, Disp: , Rfl: 5 .  omeprazole (PRILOSEC) 20 MG capsule, Take 20 mg by mouth., Disp: , Rfl:  .  traZODone (DESYREL) 50 MG tablet, Take 100 mg by mouth., Disp: , Rfl:  .  azelastine (ASTELIN) 137 MCG/SPRAY nasal spray, Place 1 spray into the nose daily as needed. Use in each nostril as directed for allergies/congestion, Disp: , Rfl:  .  clotrimazole-betamethasone (LOTRISONE) cream, Apply 1 fingertip amount to scaling, inflamed areas twice daily, Disp: 30 g, Rfl: 0 .  fluconazole (DIFLUCAN) 150 MG tablet, Take 1 tablet (150 mg total) once a week by mouth., Disp: 6 tablet, Rfl: 1 .  oxyCODONE-acetaminophen (PERCOCET/ROXICET) 5-325 MG per tablet, Take 1-2 tablets by mouth every 4 (four) hours as needed (moderate - severe pain). (Patient not taking: Reported  on 07/24/2017), Disp: 30 tablet, Rfl: 0 .  pantoprazole (PROTONIX) 40 MG tablet, Take 40 mg by mouth 2 (two) times daily. , Disp: , Rfl:  .  Prenatal Vit-Fe Fumarate-FA (PRENATAL MULTIVITAMIN) TABS, Take 1 tablet by mouth at bedtime. , Disp: , Rfl:   Allergies  Allergen Reactions  . Ace Inhibitors Other (See Comments)    Constant dry cough Constant dry cough   . Azithromycin Other (See Comments)    Neurological losses. Cannot sense the difference between hot and cold Numbness from head to toe, feels no sensation (neurological) Neurological losses. Cannot sense the difference between hot and cold   . Tape Rash    rash      Review of Systems  HENT: Positive for sinus pressure and sinus pain.   Musculoskeletal: Positive for back pain and myalgias.  Skin: Positive for rash.  Allergic/Immunologic: Positive for environmental allergies.  Psychiatric/Behavioral: Positive for behavioral problems.  All other systems reviewed and are negative.     Objective:   Physical Exam Vitals:   07/24/17 0929  BP: 115/69  Pulse: 74   General AA&O x3. Normal mood and affect.  Vascular Dorsalis pedis and posterior tibial pulses  present 2+ bilaterally  Capillary refill normal to all digits. Pedal hair growth normal.  Neurologic Epicritic sensation grossly present.  Dermatologic No open lesions. Interspaces clear of maceration. Nails well groomed and normal in appearance. Xerosis with scaling to the plantar foot bilaterally with inflammatory vesicles  Orthopedic: MMT 5/5 in dorsiflexion, plantarflexion, inversion, and eversion. Normal joint ROM without pain or crepitus.          Assessment & Plan:  She was evaluated and treated and all questions answered  Tinea Pedis bilateral -Rx Lotrisone -Rx Oral fluconazole -Educated on hygiene  Scoliosis with limb length discrepancy -Patient desires heel lift. -Will make appointment with patient for orthotist for possible heel lift

## 2017-08-08 ENCOUNTER — Ambulatory Visit: Payer: Medicaid Other | Admitting: Orthotics

## 2017-08-08 DIAGNOSIS — M2170 Unequal limb length (acquired), unspecified site: Secondary | ICD-10-CM

## 2017-08-08 DIAGNOSIS — B353 Tinea pedis: Secondary | ICD-10-CM

## 2017-08-08 DIAGNOSIS — M419 Scoliosis, unspecified: Secondary | ICD-10-CM

## 2017-08-08 NOTE — Progress Notes (Signed)
Patient seen today and evaluated for f/o to address LLD.  Patient was told that her left leg was longer than right; however evaluation is just the opposite.  I gave her a 1/4" lift to put in LEFT shoe to see how she tolerates before any sort of fabrication of F/O.

## 2017-08-15 MED FILL — traZODone HCL 50 MG TABS: 50 | 30 days supply | Qty: 60 | Fill #4

## 2017-08-15 MED FILL — NORG-ETHIN ESTRA 0.25-0.035: 0.25-35 | 28 days supply | Qty: 28 | Fill #3

## 2017-08-15 MED FILL — MONTELUKAST SOD 10 MG TAB: 10 | 30 days supply | Qty: 30 | Fill #3

## 2017-08-16 MED FILL — LOSARTAN POTASSIUM 50 MG TA: 50 | 30 days supply | Qty: 30 | Fill #0

## 2017-08-16 MED FILL — FLUoxetine HCL 20 MG CAPS: 20 | 30 days supply | Qty: 30 | Fill #0

## 2017-08-16 MED FILL — FLUCONAZOLE 150 MG TABLET: 150 | 28 days supply | Qty: 4 | Fill #1

## 2017-08-16 MED FILL — OMEPRAZOLE 20 MG CAP: 20 | 30 days supply | Qty: 30 | Fill #0

## 2017-09-10 ENCOUNTER — Ambulatory Visit: Payer: Medicaid Other | Admitting: Podiatry

## 2017-09-12 MED FILL — MONTELUKAST SOD 10 MG TAB: 10 | 30 days supply | Qty: 30 | Fill #4

## 2017-09-12 MED FILL — OMEPRAZOLE 20 MG CAP: 20 | 30 days supply | Qty: 30 | Fill #1

## 2017-09-12 MED FILL — LOSARTAN POTASSIUM 50 MG TA: 50 | 30 days supply | Qty: 30 | Fill #1

## 2017-09-12 MED FILL — FLUoxetine HCL 20 MG CAPS: 20 | 30 days supply | Qty: 30 | Fill #1

## 2017-09-12 MED FILL — FEMYNOR 0.25-35 MG-MCG TABS: 0.25-35 | 28 days supply | Qty: 28 | Fill #4

## 2017-10-08 MED FILL — OMEPRAZOLE 20 MG CAP: 20 | 30 days supply | Qty: 30 | Fill #2

## 2017-10-08 MED FILL — LOSARTAN POTASSIUM 50 MG TA: 50 | 30 days supply | Qty: 30 | Fill #2

## 2017-10-08 MED FILL — MONTELUKAST SOD 10 MG TAB: 10 | 30 days supply | Qty: 30 | Fill #5

## 2017-10-08 MED FILL — FEMYNOR 0.25-35 MG-MCG TABS: 0.25-35 | 28 days supply | Qty: 28 | Fill #5

## 2017-10-08 MED FILL — FLUoxetine HCL 20 MG CAPS: 20 | 30 days supply | Qty: 30 | Fill #2

## 2017-11-05 MED FILL — MONTELUKAST SOD 10 MG TAB: 10 | 30 days supply | Qty: 30 | Fill #6

## 2017-11-05 MED FILL — FLUoxetine HCL 20 MG CAPS: 20 | 30 days supply | Qty: 30 | Fill #3

## 2017-11-05 MED FILL — OMEPRAZOLE 20 MG CAP: 20 | 30 days supply | Qty: 30 | Fill #3

## 2017-11-05 MED FILL — LOSARTAN POTASSIUM 50 MG TA: 50 | 30 days supply | Qty: 30 | Fill #3

## 2017-11-05 MED FILL — PREVIFEM 0.25-35 MG-MCG TAB: 0.25-35 | 28 days supply | Qty: 28 | Fill #0

## 2017-11-28 MED FILL — PROAIR HFA 90 MCG INHALER: 108 (90 BAS | 16 days supply | Qty: 9 | Fill #0

## 2017-12-03 MED FILL — FLUoxetine HCL 20 MG CAPS: 20 | 30 days supply | Qty: 30 | Fill #4

## 2017-12-03 MED FILL — LOSARTAN POTASSIUM 50 MG TA: 50 | 30 days supply | Qty: 30 | Fill #4

## 2017-12-03 MED FILL — PREVIFEM 0.25-35 MG-MCG TAB: 0.25-35 | 28 days supply | Qty: 28 | Fill #1

## 2017-12-03 MED FILL — MONTELUKAST SOD 10 MG TAB: 10 | 30 days supply | Qty: 30 | Fill #7

## 2017-12-12 MED FILL — SPIRONOLACTONE 50 MG TAB: 50 | 30 days supply | Qty: 30 | Fill #0

## 2017-12-19 MED FILL — EPINEPHRINE 0.3 MG AUTO-INJ: 0.3 | 30 days supply | Qty: 2 | Fill #0

## 2017-12-20 MED FILL — SPIRIVA RESPIMAT 1.25 MCG I: 1.25 | 30 days supply | Qty: 4 | Fill #0

## 2018-01-01 MED FILL — PREVIFEM 0.25-35 MG-MCG TAB: 0.25-35 | 28 days supply | Qty: 28 | Fill #2

## 2018-01-01 MED FILL — LOSARTAN POTASSIUM 50 MG TA: 50 | 30 days supply | Qty: 30 | Fill #5

## 2018-01-01 MED FILL — MONTELUKAST SOD 10 MG TAB: 10 | 30 days supply | Qty: 30 | Fill #8

## 2018-01-01 MED FILL — FLUoxetine HCL 20 MG CAPS: 20 | 30 days supply | Qty: 30 | Fill #5

## 2018-01-14 MED FILL — CIPRODEX OTIC SUSPENSION: 0.3-0.1 | 18 days supply | Qty: 8 | Fill #0

## 2018-01-15 MED FILL — SYMBICORT 160-4.5 MCG INH: 160-4.5 | 30 days supply | Qty: 10 | Fill #0

## 2018-01-30 MED FILL — MONTELUKAST SOD 10 MG TAB: 10 | 30 days supply | Qty: 30 | Fill #0

## 2018-01-30 MED FILL — OMEPRAZOLE 20 MG CAP: 20 | 30 days supply | Qty: 30 | Fill #4

## 2018-01-30 MED FILL — FLUoxetine HCL 20 MG CAPS: 20 | 30 days supply | Qty: 30 | Fill #6

## 2018-01-30 MED FILL — FEMYNOR 0.25-35 MG-MCG TABS: 0.25-35 | 28 days supply | Qty: 28 | Fill #0

## 2018-01-30 MED FILL — LOSARTAN POTASSIUM 50 MG TA: 50 | 30 days supply | Qty: 30 | Fill #6

## 2018-01-30 MED FILL — traZODone HCL 50 MG TABS: 50 | 30 days supply | Qty: 60 | Fill #5

## 2018-01-30 MED FILL — FLUCONAZOLE 150 MG TABS: 150 | 28 days supply | Qty: 4 | Fill #2

## 2018-02-06 MED FILL — FLUTICASONE PROP 50 MCG SPR: 50 | 30 days supply | Qty: 16 | Fill #1

## 2018-02-28 MED FILL — SYMBICORT 160-4.5 MCG INH: 160-4.5 | 30 days supply | Qty: 10 | Fill #1

## 2018-02-28 MED FILL — traZODone HCL 50 MG TABS: 50 | 30 days supply | Qty: 60 | Fill #6

## 2018-02-28 MED FILL — FLUoxetine HCL 20 MG CAPS: 20 | 30 days supply | Qty: 30 | Fill #7

## 2018-02-28 MED FILL — MONTELUKAST SOD 10 MG TAB: 10 | 30 days supply | Qty: 30 | Fill #1

## 2018-02-28 MED FILL — FEMYNOR 0.25-35 MG-MCG TABS: 0.25-35 | 28 days supply | Qty: 28 | Fill #1

## 2018-02-28 MED FILL — LOSARTAN POTASSIUM 50 MG TA: 50 | 30 days supply | Qty: 30 | Fill #7

## 2018-02-28 MED FILL — OMEPRAZOLE 20 MG CAP: 20 | 30 days supply | Qty: 30 | Fill #5

## 2018-03-27 MED FILL — FEMYNOR 0.25-35 MG-MCG TABS: 0.25-35 | 28 days supply | Qty: 28 | Fill #2

## 2018-03-27 MED FILL — FLUoxetine HCL 20 MG CAPS: 20 | 30 days supply | Qty: 30 | Fill #8

## 2018-03-27 MED FILL — LOSARTAN POTASSIUM 50 MG TA: 50 | 30 days supply | Qty: 30 | Fill #8

## 2018-03-27 MED FILL — MONTELUKAST SOD 10 MG TAB: 10 | 30 days supply | Qty: 30 | Fill #2

## 2018-03-27 MED FILL — traZODone HCL 50 MG TABS: 50 | 30 days supply | Qty: 60 | Fill #7

## 2018-03-27 MED FILL — OMEPRAZOLE 20 MG CAP: 20 | 30 days supply | Qty: 30 | Fill #6

## 2018-04-16 MED FILL — AZELASTINE HCL 137 MCG SPRY: 0.1 | 25 days supply | Qty: 30 | Fill #0

## 2018-04-22 MED FILL — LOSARTAN POTASSIUM 50 MG TA: 50 | 30 days supply | Qty: 30 | Fill #9

## 2018-04-22 MED FILL — MONTELUKAST SOD 10 MG TAB: 10 | 30 days supply | Qty: 30 | Fill #3

## 2018-04-22 MED FILL — OMEPRAZOLE 20 MG CAP: 20 | 30 days supply | Qty: 30 | Fill #7

## 2018-04-22 MED FILL — traZODone HCL 50 MG TABS: 50 | 30 days supply | Qty: 60 | Fill #0

## 2018-04-22 MED FILL — PREVIFEM 0.25-35 MG-MCG TAB: 0.25-35 | 28 days supply | Qty: 28 | Fill #3

## 2018-04-22 MED FILL — FLUoxetine HCL 20 MG CAPS: 20 | 30 days supply | Qty: 30 | Fill #9

## 2018-05-14 MED FILL — AZELASTINE HCL 137 MCG/SPRA: 137 | 25 days supply | Qty: 30 | Fill #1

## 2018-05-14 MED FILL — SYMBICORT 160-4.5 MCG INH: 160-4.5 | 30 days supply | Qty: 10 | Fill #2

## 2018-05-27 MED FILL — MONTELUKAST SOD 10 MG TAB: 10 | 30 days supply | Qty: 30 | Fill #4

## 2018-05-27 MED FILL — LOSARTAN POTASSIUM 50 MG TA: 50 | 30 days supply | Qty: 30 | Fill #10

## 2018-05-27 MED FILL — OMEPRAZOLE 20 MG CPDR: 20 | 30 days supply | Qty: 30 | Fill #8

## 2018-06-28 MED FILL — AZELASTINE HCL 137 MCG/SPRA: 137 | 25 days supply | Qty: 30 | Fill #2

## 2018-06-28 MED FILL — OMEPRAZOLE 20 MG CPDR: 20 | 30 days supply | Qty: 30 | Fill #9

## 2018-06-28 MED FILL — SYMBICORT 160-4.5 MCG INH: 160-4.5 | 30 days supply | Qty: 10 | Fill #3

## 2018-06-28 MED FILL — LOSARTAN POTASSIUM 50 MG TA: 50 | 30 days supply | Qty: 30 | Fill #11

## 2018-06-28 MED FILL — MONTELUKAST SOD 10 MG TAB: 10 | 30 days supply | Qty: 30 | Fill #5

## 2018-06-28 MED FILL — FLUoxetine HCL 20 MG CAPS: 20 | 30 days supply | Qty: 30 | Fill #10

## 2018-06-28 MED FILL — PROAIR HFA 90 MCG INHALER: 108 (90 BAS | 16 days supply | Qty: 9 | Fill #1

## 2018-07-30 MED FILL — traZODone HCL 50 MG TABS: 50 | 30 days supply | Qty: 60 | Fill #1

## 2018-07-30 MED FILL — LOSARTAN POTASSIUM 50 MG TA: 50 | 90 days supply | Qty: 90 | Fill #0

## 2018-07-30 MED FILL — MONTELUKAST SOD 10 MG TAB: 10 | 30 days supply | Qty: 30 | Fill #0

## 2018-07-30 MED FILL — OMEPRAZOLE 20 MG CPDR: 20 | 30 days supply | Qty: 30 | Fill #10

## 2018-08-26 MED FILL — FLUCONAZOLE 150 MG TABS: 150 | 1 days supply | Qty: 1 | Fill #0

## 2018-08-27 MED FILL — OMEPRAZOLE 20 MG CPDR: 20 | 30 days supply | Qty: 30 | Fill #0

## 2018-08-27 MED FILL — FLUoxetine HCL 20 MG CAPS: 20 | 30 days supply | Qty: 30 | Fill #0

## 2018-08-27 MED FILL — MONTELUKAST SOD 10 MG TAB: 10 | 30 days supply | Qty: 30 | Fill #1

## 2018-08-30 ENCOUNTER — Encounter: Payer: Self-pay | Admitting: Sports Medicine

## 2018-08-30 ENCOUNTER — Ambulatory Visit (INDEPENDENT_AMBULATORY_CARE_PROVIDER_SITE_OTHER): Payer: Medicaid Other

## 2018-08-30 ENCOUNTER — Ambulatory Visit (INDEPENDENT_AMBULATORY_CARE_PROVIDER_SITE_OTHER): Payer: Medicaid Other | Admitting: Sports Medicine

## 2018-08-30 DIAGNOSIS — M7751 Other enthesopathy of right foot: Secondary | ICD-10-CM

## 2018-08-30 DIAGNOSIS — S93601A Unspecified sprain of right foot, initial encounter: Secondary | ICD-10-CM

## 2018-08-30 DIAGNOSIS — M79671 Pain in right foot: Secondary | ICD-10-CM

## 2018-08-30 DIAGNOSIS — M779 Enthesopathy, unspecified: Secondary | ICD-10-CM

## 2018-08-30 MED ORDER — TRIAMCINOLONE ACETONIDE 10 MG/ML IJ SUSP
10.0000 mg | Freq: Once | INTRAMUSCULAR | Status: AC
  Administered 2018-08-30: 10 mg

## 2018-08-30 NOTE — Progress Notes (Signed)
Subjective: Heidi Schmitt is a 37 y.o. female patient who presents to office for evaluation of right foot pain. Patient complains of progressive pain especially over the last 6 months in the right foot at the top. Ranks pain 5/10 and is now interferring with daily activities. Patient has tried taping, ankle brace, over-the-counter insoles and an old pair of custom insoles, and ibuprofen with no relief in symptoms. Patient denies any other pedal complaints.  Patient admits to taking a misstep while being active while running and felt a pop but continued to burn because she did not feel like anything was broken at the time but since has had pain.  Admits to a little swelling from time to time.  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  There are no active problems to display for this patient.   Current Outpatient Medications on File Prior to Visit  Medication Sig Dispense Refill  . azelastine (ASTELIN) 137 MCG/SPRAY nasal spray Place 1 spray into the nose daily as needed. Use in each nostril as directed for allergies/congestion    . cetirizine (ZYRTEC) 10 MG tablet Take 10 mg by mouth daily.    . Cholecalciferol (VITAMIN D3) 5000 units TABS Take by mouth.    . ciclopirox (LOPROX) 0.77 % cream APPLY TO FEET TWICE PER DAY FOR 14 DAYS    . clotrimazole-betamethasone (LOTRISONE) cream Apply 1 fingertip amount to scaling, inflamed areas twice daily 30 g 0  . DOCOSAHEXAENOIC ACID PO Take 1 g by mouth.    . EPINEPHrine 0.3 mg/0.3 mL IJ SOAJ injection Use as directed for life threatening allergic reaction. Please use Mylan generic or brand    . fluconazole (DIFLUCAN) 150 MG tablet Take 1 tablet (150 mg total) once a week by mouth. 6 tablet 1  . FLUoxetine (PROZAC) 20 MG capsule Take 20 mg daily by mouth.  0  . fluticasone (FLONASE) 50 MCG/ACT nasal spray Place 2 sprays into the nose daily.    Marland Kitchen  ibuprofen (ADVIL,MOTRIN) 600 MG tablet Take 1 tablet (600 mg total) by mouth every 6 (six) hours. 30 tablet 2  . levonorgestrel (MIRENA) 20 MCG/24HR IUD by Intrauterine route.    Marland Kitchen losartan (COZAAR) 50 MG tablet Take 50 mg by mouth.    . montelukast (SINGULAIR) 10 MG tablet Take 10 mg by mouth at bedtime as needed. For allergies    . norgestimate-ethinyl estradiol (ORTHO-CYCLEN,SPRINTEC,PREVIFEM) 0.25-35 MG-MCG tablet Take 1 tablet daily by mouth. as directed  5  . omeprazole (PRILOSEC) 20 MG capsule Take 20 mg by mouth.    . oxyCODONE-acetaminophen (PERCOCET/ROXICET) 5-325 MG per tablet Take 1-2 tablets by mouth every 4 (four) hours as needed (moderate - severe pain). (Patient not taking: Reported on 07/24/2017) 30 tablet 0  . pantoprazole (PROTONIX) 40 MG tablet Take 40 mg by mouth 2 (two) times daily.     . Prenatal Vit-Fe Fumarate-FA (PRENATAL MULTIVITAMIN) TABS Take 1 tablet by mouth at bedtime.     . traZODone (DESYREL) 50 MG tablet Take 100 mg by mouth.     No current facility-administered medications on file prior to visit.     Allergies  Allergen Reactions  . Ace Inhibitors Other (See Comments)    Constant dry cough Constant dry cough   . Azithromycin Other (See Comments)    Neurological losses. Cannot sense the difference between hot  and cold Numbness from head to toe, feels no sensation (neurological) Neurological losses. Cannot sense the difference between hot and cold   . Tape Rash    rash    Objective:  General: Alert and oriented x3 in no acute distress  Dermatology: No open lesions bilateral lower extremities, no webspace macerations, no ecchymosis bilateral, all nails x 10 are well manicured.  Vascular: Dorsalis Pedis and Posterior Tibial pedal pulses palpable, Capillary Fill Time 3 seconds,(+) pedal hair growth bilateral, no edema bilateral lower extremities, Temperature gradient within normal limits.  Neurology: Johney Maine sensation intact via light touch  bilateral.  Musculoskeletal: Moderate tenderness to palpation across the dorsal midfoot and extensor tendon complex on right foot, no pain with range of motion.  Bunion deformity.  Strength within normal limits in all groups bilateral.   Gait: Antalgic gait  Xrays  Right foot   Impression: Normal osseous mineralization, increased first second metatarsal intermetatarsal angle consistent with bunion deformity, no distinct fracture or dislocation.  Soft tissues within normal limits.  Assessment and Plan: Problem List Items Addressed This Visit    None    Visit Diagnoses    Right foot pain    -  Primary   Relevant Medications   triamcinolone acetonide (KENALOG) 10 MG/ML injection 10 mg (Completed)   Other Relevant Orders   DG Foot Complete Right   Tendonitis       Relevant Medications   triamcinolone acetonide (KENALOG) 10 MG/ML injection 10 mg (Completed)   Capsulitis       Sprain of right foot, initial encounter           -Complete examination performed -Xrays reviewed -Discussed treatement options for possible tendinitis versus capsulitis versus metatarsal sprain -After oral consent and aseptic prep, injected a mixture containing 1 ml of 2%  plain lidocaine, 1 ml 0.5% plain marcaine, 0.5 ml of kenalog 10 and 0.5 ml of dexamethasone phosphate into right dorsal midfoot over extensor tendons without complication. Post-injection care discussed with patient.  -Dispensed ice pack and instructed patient on use -Advised patient to use cam boot patient declined to get one from our office and states that she will see if her friend has one that she can borrow or buy one off [place] -Patient to return to office in 2 weeks if pain is not better will order MRI or sooner if condition worsens.  Landis Martins, DPM

## 2018-09-02 ENCOUNTER — Other Ambulatory Visit: Payer: Self-pay | Admitting: Sports Medicine

## 2018-09-02 DIAGNOSIS — M779 Enthesopathy, unspecified: Secondary | ICD-10-CM

## 2018-09-02 DIAGNOSIS — M79671 Pain in right foot: Secondary | ICD-10-CM

## 2018-09-02 DIAGNOSIS — S93401A Sprain of unspecified ligament of right ankle, initial encounter: Secondary | ICD-10-CM

## 2018-09-09 ENCOUNTER — Telehealth: Payer: Self-pay | Admitting: Sports Medicine

## 2018-09-09 DIAGNOSIS — T148XXA Other injury of unspecified body region, initial encounter: Secondary | ICD-10-CM

## 2018-09-09 DIAGNOSIS — M79671 Pain in right foot: Secondary | ICD-10-CM

## 2018-09-09 DIAGNOSIS — M779 Enthesopathy, unspecified: Secondary | ICD-10-CM

## 2018-09-09 MED FILL — FLUoxetine HCL 10 MG CAPS: 10 | 90 days supply | Qty: 90 | Fill #0

## 2018-09-09 NOTE — Telephone Encounter (Signed)
Val If patient agrees we can go ahead and order a MRI for her foot and ankle for further eval to r/o tear Thanks Dr. Cannon Kettle

## 2018-09-09 NOTE — Telephone Encounter (Signed)
I called pt and asked if while she was in the boot she had decreased activity or had continued as normal, pt states she decreased the activity. I asked pt is she had the pain in the boot or when out of the boot. Pt states she has the pain in and out of the boot. I told pt to stay in the boot, keep her activity down to just up to the bathroom and snacks. Pt states she didn't want to hear that, she wanted to go work out today, I told her not at this time.

## 2018-09-09 NOTE — Addendum Note (Signed)
Addended by: Harriett Sine D on: 09/09/2018 04:27 PM   Modules accepted: Orders

## 2018-09-09 NOTE — Telephone Encounter (Signed)
Pt called to update doctor. Pt wore boot for 10 days and now outside of ankle is sore(popping/grinding) and boot does not help that area of ankle. What is the next step? Please give pt a call back.

## 2018-09-10 NOTE — Telephone Encounter (Signed)
[employer] - Medicaid request clinicals to evaluate prior to pre-cert of [id number]. Faxed 08/30/2018 clinicals to [facility], Service order:  [id number].

## 2018-09-12 ENCOUNTER — Telehealth: Payer: Self-pay | Admitting: *Deleted

## 2018-09-12 NOTE — Telephone Encounter (Signed)
[employer] denied pre-cert of MRI right ankle [id number] for Case:[id number]. Routed message for pt to call [employer] [phone] for PEER TO PEER.

## 2018-09-12 NOTE — Telephone Encounter (Signed)
I called for Peer to Peer they are having technical difficulties and told me to call back after 12 noon. Yes she should reschedule her appt until we can get her MRI done.  -Dr. Cannon Kettle

## 2018-09-12 NOTE — Telephone Encounter (Signed)
-----   Message from Landis Martins, [place] sent at 09/12/2018  7:54 AM EST ----- Regarding: Appt for tomorrow Patient does not need appointment for tomorrow until after her MRI is done Thanks Dr. Cannon Kettle

## 2018-09-12 NOTE — Telephone Encounter (Signed)
I informed pt of Dr. Leeanne Rio recommendation to reschedule tomorrow's appt once the MRI Results were available. Pt states understanding and I routed message to schedulers to cancel 09/13/2018 appt.

## 2018-09-13 ENCOUNTER — Ambulatory Visit: Payer: Medicaid Other | Admitting: Sports Medicine

## 2018-09-16 NOTE — Telephone Encounter (Signed)
[facility] scheduled pt at [facility] 09/18/2018 arrive at 2:45pm for 3:00pm imaging. Faxed orders to [facility].

## 2018-09-16 NOTE — Telephone Encounter (Signed)
-----   Message from Landis Martins, [place] sent at 09/13/2018  7:01 PM EST ----- Regarding: MRI Ankle Peer to Peer completed MRI Authorized  [id number] Exp Jan. 30  -Dr. Cannon Kettle

## 2018-09-16 NOTE — Telephone Encounter (Signed)
I informed pt of 09/18/2018 [facility] appt.

## 2018-09-19 ENCOUNTER — Encounter: Payer: Self-pay | Admitting: Sports Medicine

## 2018-09-20 ENCOUNTER — Telehealth: Payer: Self-pay | Admitting: *Deleted

## 2018-09-20 MED ORDER — MELOXICAM 15 MG PO TABS: 15.0000 mg | ORAL_TABLET | Freq: Every day | ORAL | 0 refills | Status: DC

## 2018-09-20 MED FILL — MELOXICAM 15 MG TABLET: 15 | 30 days supply | Qty: 30 | Fill #0

## 2018-09-20 NOTE — Telephone Encounter (Signed)
Patient called requesting results of the MRI done on Tuesday.  (Results are scanned in)

## 2018-09-20 NOTE — Telephone Encounter (Signed)
Val Will you let the patient know that the MRI shows that she has a bone bruise of the 3rd and 4th metatarsals She should use CAM boot if she has it or a shoe that helps to decrease the pressure to her forefoot, rest, ice, elevation and if she can take NSAIDs then we can Rx her Mobic to use as well. She should follow up if still painful within 2 weeks

## 2018-09-20 NOTE — Telephone Encounter (Signed)
I called pt and infaormed of Dr. Leeanne Rio review of results MRI and orders. Pt states understanding and requested the Mobic. I instructed pt to stop other ANSAIDS and she stated understanding. Pt states she can not wear the cam boot, it worsens the pain in her ankle. I offered pt, to pick up a surgical shoe in [place], stating it may not give as much immobilization as the cam boot, but would hopefully help some. Pt agreed.

## 2018-10-31 MED FILL — OMEPRAZOLE 20 MG CPDR: 20 | 30 days supply | Qty: 30 | Fill #1

## 2018-10-31 MED FILL — PROAIR HFA 90 MCG INHALER: 108 (90 BAS | 16 days supply | Qty: 9 | Fill #2

## 2018-10-31 MED FILL — traZODone HCL 50 MG TABS: 50 | 30 days supply | Qty: 60 | Fill #2

## 2018-10-31 MED FILL — LOSARTAN POTASSIUM 50 MG TA: 50 | 90 days supply | Qty: 90 | Fill #1

## 2018-10-31 MED FILL — MONTELUKAST SOD 10 MG TAB: 10 | 30 days supply | Qty: 30 | Fill #2

## 2018-11-25 MED FILL — OMEPRAZOLE 20 MG CPDR: 20 | 30 days supply | Qty: 30 | Fill #2 | Status: TO

## 2018-11-25 MED FILL — MONTELUKAST SOD 10 MG TAB: 10 | 30 days supply | Qty: 30 | Fill #3 | Status: TO

## 2018-12-24 MED FILL — OMEPRAZOLE 20 MG CPDR: 20 | 30 days supply | Qty: 30 | Fill #0

## 2018-12-24 MED FILL — MONTELUKAST SOD 10 MG TAB: 10 | 30 days supply | Qty: 30 | Fill #0

## 2019-01-16 MED FILL — OMEPRAZOLE 20 MG CPDR: 20 | 30 days supply | Qty: 30 | Fill #1

## 2019-01-16 MED FILL — MONTELUKAST SOD 10 MG TAB: 10 | 30 days supply | Qty: 30 | Fill #1

## 2019-01-16 MED FILL — traZODone HCL 50 MG TABS: 50 | 30 days supply | Qty: 60 | Fill #0 | Status: TO

## 2019-01-20 MED FILL — LOSARTAN POTASSIUM 50 MG TA: 50 | 30 days supply | Qty: 30 | Fill #0 | Status: TO

## 2019-01-29 MED FILL — CLINDAMYCIN HCL 300 MG CAPS: 300 | 10 days supply | Qty: 30 | Fill #0

## 2019-02-18 ENCOUNTER — Other Ambulatory Visit: Payer: Self-pay | Admitting: Family Medicine

## 2019-02-18 MED FILL — LOSARTAN POTASSIUM 50 MG TA: 50 | 30 days supply | Qty: 30 | Fill #0

## 2019-02-18 MED FILL — traZODone HCL 50 MG TABS: 50 | 30 days supply | Qty: 60 | Fill #0

## 2019-02-18 MED FILL — MONTELUKAST SOD 10 MG TAB: 10 | 30 days supply | Qty: 30 | Fill #0

## 2019-03-05 MED FILL — CIPRODEX OTIC SUSPENSION: 0.3-0.1 | 10 days supply | Qty: 8 | Fill #0

## 2019-03-24 MED FILL — LOSARTAN POTASSIUM 50 MG TA: 50 | 30 days supply | Qty: 30 | Fill #1

## 2019-03-24 MED FILL — traZODone HCL 50 MG TABS: 50 | 30 days supply | Qty: 60 | Fill #1

## 2019-03-24 MED FILL — MONTELUKAST SOD 10 MG TAB: 10 | 30 days supply | Qty: 30 | Fill #1

## 2019-04-21 MED FILL — CYCLOBENZAPRINE 10 MG TAB: 10 | 20 days supply | Qty: 60 | Fill #0

## 2019-04-23 ENCOUNTER — Other Ambulatory Visit: Payer: Self-pay | Admitting: Family Medicine

## 2019-04-23 NOTE — Telephone Encounter (Signed)
Requested medication (s) are due for refill today: yes  Requested medication (s) are on the active medication list: yes  Last refill:  03/24/2019  Future visit scheduled: no  Notes to clinic: needs to establish care? One trazodone is historical provider    Requested Prescriptions  Pending Prescriptions Disp Refills   montelukast (SINGULAIR) 10 MG tablet [Pharmacy Med Name: MONTELUKAST SOD 10 MG TAB 10 TAB] 30 tablet 1    Sig: TAKE 1 TABLET BY MOUTH NIGHTLY.     Pulmonology:  Leukotriene Inhibitors Failed - 04/23/2019 10:24 AM      Failed - Valid encounter within last 12 months    Recent Outpatient Visits    None              traZODone (DESYREL) 50 MG tablet [Pharmacy Med Name: traZODone HCL 50 MG TABS 50 TAB] 180 tablet 2    Sig: TAKE 2 TABLETS BY MOUTH NIGHTLY.     Psychiatry: Antidepressants - Serotonin Modulator Failed - 04/23/2019 10:24 AM      Failed - Valid encounter within last 6 months    Recent Outpatient Visits    None             Failed - Completed PHQ-2 or PHQ-9 in the last 360 days.

## 2019-04-24 DIAGNOSIS — Z6836 Body mass index (BMI) 36.0-36.9, adult: Secondary | ICD-10-CM | POA: Diagnosis not present

## 2019-04-24 DIAGNOSIS — H6692 Otitis media, unspecified, left ear: Secondary | ICD-10-CM | POA: Diagnosis not present

## 2019-04-24 DIAGNOSIS — H9202 Otalgia, left ear: Secondary | ICD-10-CM | POA: Diagnosis not present

## 2019-04-24 DIAGNOSIS — K219 Gastro-esophageal reflux disease without esophagitis: Secondary | ICD-10-CM | POA: Diagnosis not present

## 2019-04-24 MED FILL — PANTOPRAZOLE SOD DR 40 MG T: 40 | 90 days supply | Qty: 90 | Fill #0

## 2019-04-24 MED FILL — FLUTICASONE PROP 50 MCG SPR: 50 | 30 days supply | Qty: 16 | Fill #0

## 2019-04-24 MED FILL — SYMBICORT 160-4.5 MCG INH: 160-4.5 | 30 days supply | Qty: 10 | Fill #0

## 2019-04-24 MED FILL — AMOX-CLAV 875-125 MG TABLET: 875-125 | 10 days supply | Qty: 20 | Fill #0

## 2019-04-24 MED FILL — predniSONE 50 MG TABS: 50 | 3 days supply | Qty: 3 | Fill #0

## 2019-04-24 MED FILL — LOSARTAN POTASSIUM 50 MG TA: 50 | 30 days supply | Qty: 30 | Fill #0

## 2019-04-30 MED FILL — MONTELUKAST SOD 10 MG TAB: 10 | 30 days supply | Qty: 30 | Fill #0

## 2019-04-30 MED FILL — traZODone HCL 50 MG TABS: 50 | 90 days supply | Qty: 180 | Fill #0

## 2019-05-14 DIAGNOSIS — Z9622 Myringotomy tube(s) status: Secondary | ICD-10-CM | POA: Diagnosis not present

## 2019-05-14 DIAGNOSIS — T85618A Breakdown (mechanical) of other specified internal prosthetic devices, implants and grafts, initial encounter: Secondary | ICD-10-CM | POA: Diagnosis not present

## 2019-05-14 DIAGNOSIS — T161XXA Foreign body in right ear, initial encounter: Secondary | ICD-10-CM | POA: Diagnosis not present

## 2019-05-14 DIAGNOSIS — Z8669 Personal history of other diseases of the nervous system and sense organs: Secondary | ICD-10-CM | POA: Diagnosis not present

## 2019-05-14 DIAGNOSIS — H938X9 Other specified disorders of ear, unspecified ear: Secondary | ICD-10-CM | POA: Diagnosis not present

## 2019-05-29 DIAGNOSIS — K210 Gastro-esophageal reflux disease with esophagitis: Secondary | ICD-10-CM | POA: Diagnosis not present

## 2019-05-29 DIAGNOSIS — Z23 Encounter for immunization: Secondary | ICD-10-CM | POA: Diagnosis not present

## 2020-05-23 ENCOUNTER — Other Ambulatory Visit (HOSPITAL_COMMUNITY): Payer: Self-pay | Admitting: Nurse Practitioner

## 2020-05-23 DIAGNOSIS — U071 COVID-19: Secondary | ICD-10-CM

## 2020-05-23 NOTE — Progress Notes (Signed)
I connected by phone with Rene Paci on 05/23/2020 at 7:33 PM to discuss the potential use of an new treatment for mild to moderate COVID-19 viral infection in non-hospitalized patients.  This patient is a 39 y.o. female that meets the FDA criteria for Emergency Use Authorization of casirivimab\imdevimab.  Has a (+) direct SARS-CoV-2 viral test result  Has mild or moderate COVID-19   Is ? 40 years of age and weighs ? 40 kg  Is NOT hospitalized due to COVID-19  Is NOT requiring oxygen therapy or requiring an increase in baseline oxygen flow rate due to COVID-19  Is within 10 days of symptom onset  Has at least one of the high risk factor(s) for progression to severe COVID-19 and/or hospitalization as defined in EUA.  Specific high risk criteria : BMI > 25 and Chronic Lung Disease- asthma on symbicort   Sx onset 9/11. Unvaccinated.   I have spoken and communicated the following to the patient or parent/caregiver:  1. FDA has authorized the emergency use of casirivimab\imdevimab for the treatment of mild to moderate COVID-19 in adults and pediatric patients with positive results of direct SARS-CoV-2 viral testing who are 47 years of age and older weighing at least 40 kg, and who are at high risk for progressing to severe COVID-19 and/or hospitalization.  2. The significant known and potential risks and benefits of casirivimab\imdevimab, and the extent to which such potential risks and benefits are unknown.  3. Information on available alternative treatments and the risks and benefits of those alternatives, including clinical trials.  4. Patients treated with casirivimab\imdevimab should continue to self-isolate and use infection control measures (e.g., wear mask, isolate, social distance, avoid sharing personal items, clean and disinfect "high touch" surfaces, and frequent handwashing) according to CDC guidelines.   5. The patient or parent/caregiver has the option to accept or refuse  casirivimab\imdevimab .  After reviewing this information with the patient, The patient agreed to proceed with receiving casirivimab\imdevimab infusion and will be provided a copy of the Fact sheet prior to receiving the infusion.Beckey Rutter, [place], AGNP-C [phone] ([place])

## 2020-05-25 ENCOUNTER — Ambulatory Visit (HOSPITAL_COMMUNITY)
Admission: RE | Admit: 2020-05-25 | Discharge: 2020-05-25 | Disposition: A | Discharge status: Home / Self Care | Payer: Commercial Managed Care - PPO | Source: Ambulatory Visit | Attending: Pulmonary Disease | Admitting: Pulmonary Disease

## 2020-05-25 ENCOUNTER — Other Ambulatory Visit (HOSPITAL_COMMUNITY): Payer: Self-pay

## 2020-05-25 DIAGNOSIS — J45909 Unspecified asthma, uncomplicated: Secondary | ICD-10-CM | POA: Diagnosis not present

## 2020-05-25 DIAGNOSIS — Z7951 Long term (current) use of inhaled steroids: Secondary | ICD-10-CM | POA: Diagnosis not present

## 2020-05-25 DIAGNOSIS — Z23 Encounter for immunization: Secondary | ICD-10-CM | POA: Insufficient documentation

## 2020-05-25 DIAGNOSIS — U071 COVID-19: Secondary | ICD-10-CM | POA: Diagnosis present

## 2020-05-25 MED ORDER — METHYLPREDNISOLONE SODIUM SUCC 125 MG IJ SOLR: 125.0000 mg | Freq: Once | INTRAMUSCULAR | Status: DC | PRN

## 2020-05-25 MED ORDER — SODIUM CHLORIDE 0.9 % IV SOLN: INTRAVENOUS | Status: DC | PRN

## 2020-05-25 MED ORDER — SODIUM CHLORIDE 0.9 % IV SOLN
1200.0000 mg | Freq: Once | INTRAVENOUS | Status: AC
  Administered 2020-05-25: 1200 mg via INTRAVENOUS
  Filled 2020-05-25: qty 10

## 2020-05-25 MED ORDER — ALBUTEROL SULFATE HFA 108 (90 BASE) MCG/ACT IN AERS: 2.0000 | INHALATION_SPRAY | Freq: Once | RESPIRATORY_TRACT | Status: DC | PRN

## 2020-05-25 MED ORDER — FAMOTIDINE IN NACL 20-0.9 MG/50ML-% IV SOLN: 20.0000 mg | Freq: Once | INTRAVENOUS | Status: DC | PRN

## 2020-05-25 MED ORDER — EPINEPHRINE 0.3 MG/0.3ML IJ SOAJ: 0.3000 mg | Freq: Once | INTRAMUSCULAR | Status: DC | PRN

## 2020-05-25 MED ORDER — DIPHENHYDRAMINE HCL 50 MG/ML IJ SOLN: 50.0000 mg | Freq: Once | INTRAMUSCULAR | Status: DC | PRN

## 2020-05-25 NOTE — Progress Notes (Signed)
  Diagnosis: COVID-19  Physician: Dr. Patrick Wright  Procedure: [facility] Med: casirivimab\imdevimab infusion - Provided patient with casirivimab\imdevimab fact sheet for patients, parents and caregivers prior to infusion.  Complications: No immediate complications noted.  Discharge: Discharged home   Ally Yow 05/25/2020   

## 2020-05-25 NOTE — Discharge Instructions (Signed)

## 2020-12-01 ENCOUNTER — Ambulatory Visit (INDEPENDENT_AMBULATORY_CARE_PROVIDER_SITE_OTHER): Payer: Commercial Managed Care - PPO | Admitting: Allergy and Immunology

## 2020-12-01 ENCOUNTER — Other Ambulatory Visit: Payer: Self-pay

## 2020-12-01 ENCOUNTER — Encounter: Payer: Self-pay | Admitting: Allergy and Immunology

## 2020-12-01 VITALS — BP 124/82 | HR 89 | Resp 16 | Ht 67.0 in | Wt 251.6 lb

## 2020-12-01 DIAGNOSIS — L500 Allergic urticaria: Secondary | ICD-10-CM

## 2020-12-01 DIAGNOSIS — T63481A Toxic effect of venom of other arthropod, accidental (unintentional), initial encounter: Secondary | ICD-10-CM

## 2020-12-01 DIAGNOSIS — L503 Dermatographic urticaria: Secondary | ICD-10-CM

## 2020-12-01 DIAGNOSIS — J3089 Other allergic rhinitis: Secondary | ICD-10-CM | POA: Diagnosis not present

## 2020-12-01 DIAGNOSIS — J4520 Mild intermittent asthma, uncomplicated: Secondary | ICD-10-CM

## 2020-12-01 DIAGNOSIS — J301 Allergic rhinitis due to pollen: Secondary | ICD-10-CM

## 2020-12-01 MED ORDER — ALBUTEROL SULFATE HFA 108 (90 BASE) MCG/ACT IN AERS: INHALATION_SPRAY | RESPIRATORY_TRACT | 1 refills | Status: AC

## 2020-12-01 MED ORDER — EPINEPHRINE 0.3 MG/0.3ML IJ SOAJ: 0.3000 mg | INTRAMUSCULAR | 1 refills | Status: DC | PRN

## 2020-12-01 NOTE — Patient Instructions (Addendum)
  1.  Allergen avoidance measures. Sports glasses  2.  Treat and prevent inflammation:   A. Flonase - 2 sprays each nostril 2 times per day  B. Azelastine - 1 spray each nostril 2 times per day  C. Montelukast 10 mg - 1 tablet 1 time per day  3.  If needed:   A. Cetirizine 10 mg - 1-2 tablets 1-2 times per day (MAX=40mg )  B. Pataday - 1 drop each eye 1 time per day  C. Albuterol HFA - 2 inhalations every 4-6 hours  D. Auvi-Q 0.3, benadryl, MD/ER evaluation for allergic reaction  4.  Start a course of immunotherapy  5. Blood - hymenoptera venom panel, area 2 aeroallergen panel  6.  Return to clinic and summer 2022 or earlier if problem

## 2020-12-01 NOTE — Progress Notes (Signed)
[place] [facility] [place] - [place] - [place] - [place]   Dear Dr. Theda Sers,  Thank you for referring Heidi Schmitt to the [facility] on 12/01/2020.   Below is a summation of this patient's evaluation and recommendations.  Thank you for your referral. I will keep you informed about this patient's response to treatment.   If you have any questions please do not hesitate to contact me.   Sincerely,  Jiles Prows, MD Allergy / Immunology [facility]   ______________________________________________________________________    NEW PATIENT NOTE  Referring Provider: Janie Morning, DO Primary Provider: Nicoletta Dress, MD Date of office visit: 12/01/2020    Subjective:   Chief Complaint:  Heidi Schmitt (DOB: 1981-08-22) is a 40 y.o. female who presents to the clinic on 12/01/2020 with a chief complaint of Allergic Rhinitis  and Asthma .     HPI: Heidi Schmitt presents to this clinic in evaluation of atopic disease.  She has a very long history of allergic rhinoconjunctivitis for which she underwent a course of immunotherapy with Dr. Meredith Leeds in 2017-2020 after having repair of deviated septum, turbinate reduction, and adenoidectomy performed in 2016.  Then she underwent a course of immunotherapy in 2020 but only for 4 weeks because of the logistical issue in continuing this form of treatment.  Her allergic symptoms include sneezing and nose blowing and nasal congestion and itchy red watery eyes and she has had recurrent eustation tube dysfunction requiring the placement of ear ventilation tubes secondary to recurrent otitis media.  She states that 3-4 times per year she requires an antibiotic and a systemic steroid for her upper airway issue.  This appears to be a consistent problem even in the face of utilizing a nasal steroid and a nasal antihistamine and montelukast.  She has also been  diagnosed with asthma in 2020 by a local pulmonologist who gave her Symbicort which she does not use.  Her requirement for short acting bronchodilator is about 4 times per year.  This only occurs when she develops very bad problems with her upper airways.  She would like to start a course of immunotherapy.  Her husband is a paramedic and can administer the immunotherapy.  She has apparently sustained Covid infection on 2 occasions.  In September 2021 she had upper respiratory tract and constitutional symptoms requiring administration of a monoclonal anti-RBD antibody and in February 2022 she developed a Covid infection manifested for the most part as sinusitis and otitis media.  There is also a history of her developing urticaria over the course of the past decade.  Apparently this initially started after being stung by some unidentifiable insect.  Within 2 hours of that exposure she developed diffuse urticaria.  Ever since that point in time she has been developing recurrent urticaria that is completely controlled as long she uses an antihistamine.  Past Medical History:  Diagnosis Date  . Anxiety   . Asthma   . Diabetes mellitus   . Gestational diabetes    diet controlled  . H/O varicella   . Pregnancy induced hypertension   . Seasonal allergies   . Urticaria     Past Surgical History:  Procedure Laterality Date  . CESAREAN SECTION  07/04/2012   Procedure: CESAREAN SECTION;  Surgeon: Linda Hedges, DO;  Location: [facility];  Service: Obstetrics;  Laterality: N/A;  Primary cesarean section with delivery of baby boy at 0702. Apgars7/9.  Marland Kitchen  TONSILLECTOMY      Allergies as of 12/01/2020      Reactions   Ace Inhibitors Other (See Comments)   Constant dry cough Constant dry cough   Azithromycin Other (See Comments)   Neurological losses. Cannot sense the difference between hot and cold Numbness from head to toe, feels no sensation (neurological) Neurological losses. Cannot sense the  difference between hot and cold   Tape Rash   rash      Medication List    albuterol 108 (90 Base) MCG/ACT inhaler Commonly known as: VENTOLIN HFA albuterol sulfate HFA 90 mcg/actuation aerosol inhaler   ascorbic acid 1000 MG tablet Commonly known as: VITAMIN C Take by mouth.   azelastine 0.1 % nasal spray Commonly known as: ASTELIN Place 1 spray into the nose daily as needed. Use in each nostril as directed for allergies/congestion   cetirizine 10 MG tablet Commonly known as: ZYRTEC Take 10 mg by mouth daily.   EPINEPHrine 0.3 mg/0.3 mL Soaj injection Commonly known as: EPI-PEN Use as directed for life threatening allergic reaction. Please use Mylan generic or brand   fluticasone 50 MCG/ACT nasal spray Commonly known as: FLONASE Place 2 sprays into the nose daily.   levonorgestrel 20 MCG/24HR IUD Commonly known as: MIRENA by Intrauterine route.   losartan 50 MG tablet Commonly known as: COZAAR Take 50 mg by mouth.   montelukast 10 MG tablet Commonly known as: SINGULAIR TAKE 1 TABLET BY MOUTH NIGHTLY.   pantoprazole 20 MG tablet Commonly known as: PROTONIX Take 20 mg by mouth daily.   traZODone 50 MG tablet Commonly known as: DESYREL Take 100 mg by mouth.   Vitamin D3 125 MCG (5000 UT) Tabs Take by mouth.       Review of systems negative except as noted in HPI / PMHx or noted below:  Review of Systems  Constitutional: Negative.   HENT: Negative.   Eyes: Negative.   Respiratory: Negative.   Cardiovascular: Negative.   Gastrointestinal: Negative.   Genitourinary: Negative.   Musculoskeletal: Negative.   Skin: Negative.   Neurological: Negative.   Endo/Heme/Allergies: Negative.   Psychiatric/Behavioral: Negative.     Family History  Problem Relation Age of Onset  . Heart disease Father   . Alcohol abuse Father   . Birth defects Other        Angel Man syndrome  . Diabetes Maternal Aunt   . Hypertension Maternal Aunt   . Asthma Maternal  Aunt   . Heart disease Maternal Grandfather   . Diabetes Maternal Grandfather   . Hypertension Maternal Grandfather   . Asthma Mother   . Other Neg Hx   . Allergic rhinitis Neg Hx   . Angioedema Neg Hx   . Atopy Neg Hx   . Eczema Neg Hx   . Immunodeficiency Neg Hx   . Urticaria Neg Hx     Social History   Socioeconomic History  . Marital status: Married    Spouse name: Not on file  . Number of children: Not on file  . Years of education: Not on file  . Highest education level: Not on file  Occupational History  . Not on file  Tobacco Use  . Smoking status: Former Smoker    Types: Cigarettes    Quit date: 06/03/2005    Years since quitting: 15.5  . Smokeless tobacco: Never Used  Substance and Sexual Activity  . Alcohol use: No  . Drug use: No  . Sexual activity: Yes  Birth control/protection: None, I.U.D.  Other Topics Concern  . Not on file  Social History Narrative  . Not on file   Environmental and Social history  Lives in a house with a dry environment, cats and dogs located inside the household, no carpet in the bedroom, no plastic on the bed, no plastic on the pillow, and no smoking ongoing with inside the household.  She works as a Emergency planning/management officer.  Objective:   Vitals:   12/01/20 0909  BP: 124/82  Pulse: 89  Resp: 16  SpO2: 98%   Height: 5\' 7"  (170.2 cm) Weight: 251 lb 9.6 oz (114.1 kg)  Physical Exam Constitutional:      Appearance: She is not diaphoretic.     Comments: Allergic shiners  HENT:     Head: Normocephalic.     Right Ear: Tympanic membrane, ear canal and external ear normal. A PE tube is present.     Left Ear: Ear canal and external ear normal. Tympanic membrane is retracted (Retraction pocket).     Nose: Mucosal edema and rhinorrhea (Clear) present.     Mouth/Throat:     Pharynx: Uvula midline. No oropharyngeal exudate.  Eyes:     Conjunctiva/sclera: Conjunctivae normal.  Neck:     Thyroid: No thyromegaly.     Trachea:  Trachea normal. No tracheal tenderness or tracheal deviation.  Cardiovascular:     Rate and Rhythm: Normal rate and regular rhythm.     Heart sounds: Normal heart sounds, S1 normal and S2 normal. No murmur heard.   Pulmonary:     Effort: No respiratory distress.     Breath sounds: Normal breath sounds. No stridor. No wheezing or rales.  Lymphadenopathy:     Head:     Right side of head: No tonsillar adenopathy.     Left side of head: No tonsillar adenopathy.     Cervical: No cervical adenopathy.  Skin:    Findings: No erythema or rash.     Nails: There is no clubbing.  Neurological:     Mental Status: She is alert.     Diagnostics: Allergy skin tests were performed.  She had diffuse dermatographia.  Spirometry was performed and demonstrated an FEV1 of 3.94 @ 119 % of predicted. FEV1/FVC = 0.87  Assessment and Plan:    1. Perennial allergic rhinitis   2. Seasonal allergic rhinitis due to pollen   3. Asthma, mild intermittent, well-controlled   4. Allergic urticaria   5. 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  Allergen avoidance measures. Sports glasses  2.  Treat and prevent inflammation:   A. Flonase - 2 sprays each nostril 2 times per day  B. Azelastine - 1 spray each nostril 2 times per day  C. Montelukast 10 mg - 1 tablet 1 time per day  3.  If needed:   A. Cetirizine 10 mg - 1-2 tablets 1-2 times per day (MAX=40mg )  B. Pataday - 1 drop each eye 1 time per day  C. Albuterol HFA - 2 inhalations every 4-6 hours  D. Auvi-Q 0.3, benadryl, MD/ER evaluation for allergic reaction  4.  Start a course of immunotherapy  5. Blood - hymenoptera venom panel, area 2 aeroallergen panel  6.  Return to clinic and summer 2022 or earlier if problem  It does sound as though Heidi Schmitt has rather significant allergic disease although we could not define her aero allergen hypersensitivity today as she had diffuse  dermatographia on skin testing  and will follow up with an IgE area two aero allergen panel to further define this issue.  As well, she has a history of possible hymenoptera venom hypersensitivity state which will investigate with a hymenoptera IgE venom panel.  I think she should start a course of immunotherapy assuming she does demonstrate hypersensitivity to aeroallergens as this is probably the one type of therapy that will give rise to a permanent change in her atopic immune system.  She can continue on a very large collection of medical therapy as noted above.  I will contact her with the results of her blood test once they are available for review.  Jiles Prows, MD Allergy / Immunology [facility]

## 2020-12-02 ENCOUNTER — Encounter: Payer: Self-pay | Admitting: Allergy and Immunology

## 2020-12-07 ENCOUNTER — Encounter: Payer: Self-pay | Admitting: Allergy and Immunology

## 2020-12-08 ENCOUNTER — Telehealth: Payer: Self-pay | Admitting: Allergy and Immunology

## 2020-12-08 NOTE — Telephone Encounter (Signed)
Please inform Heidi Schmitt that as usual [facility] did not send the test results.  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  Please provide her information about dust mites.  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  She needs an injectable epinephrine device handy.  She can consider a course of immunotherapy against these venomous insects.

## 2020-12-08 NOTE — Telephone Encounter (Signed)
Patient called and wanted to see if her lab results had come back. She is having hives and they are itching and she is taking 2 zyrtec a day and it is helping her allergy but not the hives that kept coming up .[facility]. [phone].

## 2020-12-08 NOTE — Telephone Encounter (Signed)
Patient informed of Dr. Bruna Potter message.  Will send her avoidance measures and stinging insect allergy information by mail.  She is still having hives.  Using the cetirizine one tablet by mouth twice daily ( afraid to take more in case it makes it hard to function at work). She wants to know about doing blood tests for foods and auto-immune work up.  Please advise.

## 2020-12-08 NOTE — Telephone Encounter (Signed)
Please advise.  Labs are on your desk.

## 2020-12-10 NOTE — Telephone Encounter (Signed)
Please inform Heidi Schmitt that she should increase her cetirizine up to 40 mg daily and as long she does not develop any side effects from doing so it is okay to continue on this dose.  If she has breakthrough issues on that dose of cetirizine then we do look for possible thyroiditis or hepatitis or alpha gal syndrome or a inflammatory condition contributing to an overactive immune system.

## 2020-12-10 NOTE — Telephone Encounter (Signed)
Patient states that she is taking the Zyrtec 40mg  along with Benadryl and applying topical Benadryl. She states that she would like to go ahead and do the blood work for auto immune work up and foods. Please advise.

## 2020-12-13 ENCOUNTER — Encounter: Payer: Self-pay | Admitting: *Deleted

## 2020-12-14 ENCOUNTER — Telehealth: Payer: Self-pay | Admitting: Allergy and Immunology

## 2020-12-14 DIAGNOSIS — L500 Allergic urticaria: Secondary | ICD-10-CM

## 2020-12-14 NOTE — Telephone Encounter (Signed)
yes

## 2020-12-14 NOTE — Telephone Encounter (Signed)
Please inform Heidi Schmitt that we can give her a low-dose of prednisone at 10 mg 1 time per day for the next 10 days and then 5 mg 1 time per day for an additional 10 days to calm down her immune system.  We can obtain blood test including CBC with differential, comprehensive metabolic panel, TSH, free T4, thyroid peroxidase antibody, and pregnancy test.

## 2020-12-14 NOTE — Telephone Encounter (Signed)
Called and spoke with patient.  She does not want to use the prednisone, but is interested in the blood work.  She wants to add a food panel and autoimmune blood tests too ( ANA).  Would this be ok?  She wants to be tested for many foods, nothing specific, just a bit of everything.  Please advise.

## 2020-12-14 NOTE — Telephone Encounter (Signed)
Patient called and needs help with her hives and welts all over her body. She needs to get some relief . She talk with someone the other day and ask to see if she could get some blood drawn and see what she can eat. She has not heard back. She has been like this for 2 weeks. [phone].

## 2020-12-14 NOTE — Telephone Encounter (Signed)
Also, patient did have a pregnancy test when they changed her Mirena in February.  Would you still like the pregnancy test also?

## 2020-12-14 NOTE — Telephone Encounter (Signed)
Yes please arrange for a screening food panel and can check ANA with reflex

## 2020-12-14 NOTE — Telephone Encounter (Signed)
Please advise 

## 2020-12-14 NOTE — Telephone Encounter (Signed)
Talked with patient and came up with a large list of foods that she wants to include on her blood test order.  I have placed the [facility] order and patient is planning to come by office tomorrow to pick up order and have drawn at [facility].

## 2020-12-14 NOTE — Addendum Note (Signed)
Addended by: Zandra Abts on: 12/14/2020 05:52 PM   Modules accepted: Orders

## 2020-12-15 NOTE — Telephone Encounter (Signed)
Patient stopped by our office this morning to pick up lab order.

## 2021-01-04 ENCOUNTER — Telehealth: Payer: Self-pay

## 2021-01-04 NOTE — Telephone Encounter (Signed)
Patient informed of Dr. Bruna Potter message.  She is still having problems with reacting, but did not want to reschedule her return appointment to a sooner date at this time.  She will let us know if she needs to come in sooner and if she can think of any other blood tests she would like performed.

## 2021-01-04 NOTE — Telephone Encounter (Signed)
Please inform patient that Dr. Neldon Mc received her blood tests and per Dr. Neldon Mc, her blood looks ok, no food allergy except to shellfish.  He will discuss things further with her during return visit.  Left message for patient to call back.

## 2021-01-20 ENCOUNTER — Encounter: Payer: Self-pay | Admitting: Allergy and Immunology

## 2021-03-03 ENCOUNTER — Ambulatory Visit: Payer: Commercial Managed Care - PPO | Admitting: Allergy and Immunology

## 2021-05-11 ENCOUNTER — Other Ambulatory Visit: Payer: Self-pay | Admitting: Orthopedic Surgery

## 2021-05-11 DIAGNOSIS — M25521 Pain in right elbow: Secondary | ICD-10-CM

## 2021-05-12 ENCOUNTER — Ambulatory Visit
Admission: RE | Admit: 2021-05-12 | Discharge: 2021-05-12 | Disposition: A | Discharge status: Home / Self Care | Payer: Commercial Managed Care - PPO | Source: Ambulatory Visit | Attending: Orthopedic Surgery | Admitting: Orthopedic Surgery

## 2021-05-12 DIAGNOSIS — M25521 Pain in right elbow: Secondary | ICD-10-CM

## 2021-05-12 IMAGING — CT CT ELBOW*R* W/O CM
1 of 2 series · 9 of 14 positions shown, 12 images · non-contrast
Comparison: None.

CLINICAL DATA: Right elbow pain, unable to extend fully.
Nonspecific (abnormal) findings on radiological and other
examination of musculoskeletal system.

EXAM:
CT OF THE UPPER RIGHT EXTREMITY WITHOUT CONTRAST
CT - 3-DIMENSIONAL CT IMAGE RENDERING ON INDEPENDENT WORKSTATION
TECHNIQUE: Multidetector CT imaging of the upper right extremity was performed
according to the standard protocol.

[Series 6: thin soft · axial · 0.43mm/px · z∈[+62,+197]mm · 9 of 283 slices shown, 12 images]
[im 29/283  soft-tissue]
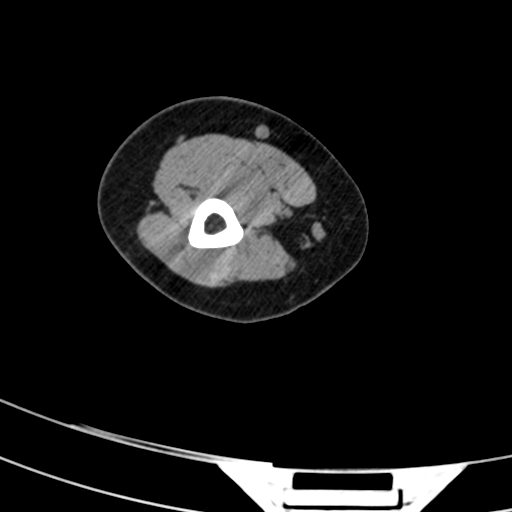
[im 29/283  bone]
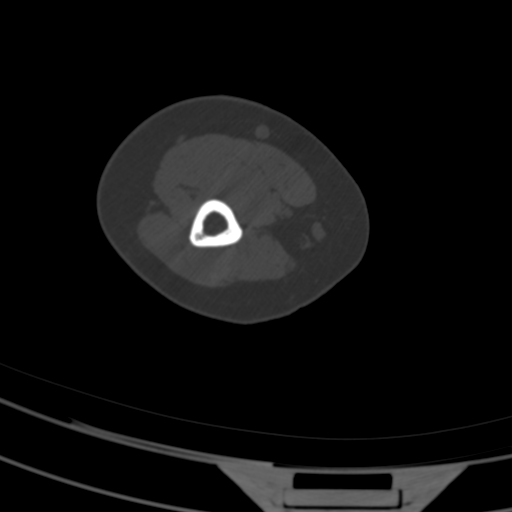
[im 57/283  bone]
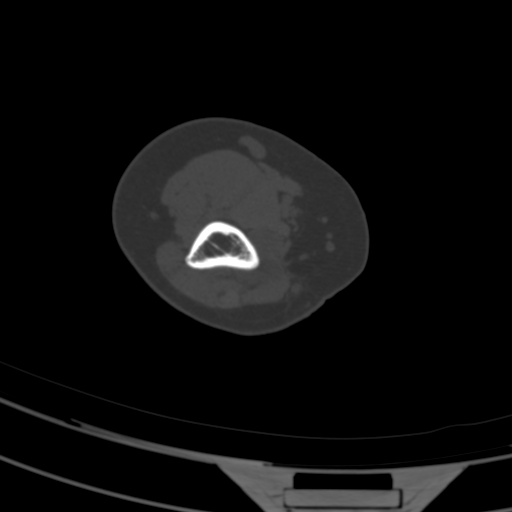
[im 85/283  bone]
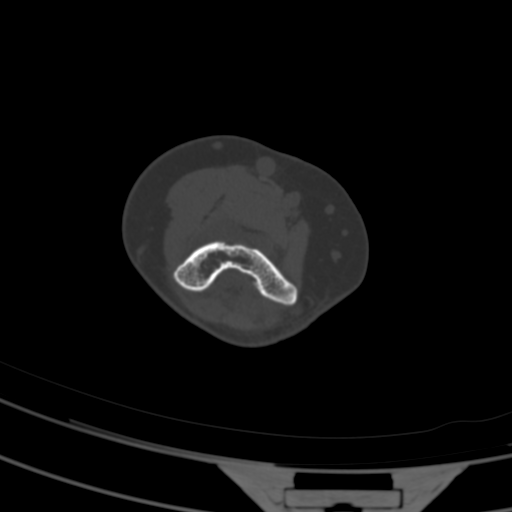
[im 113/283  bone]
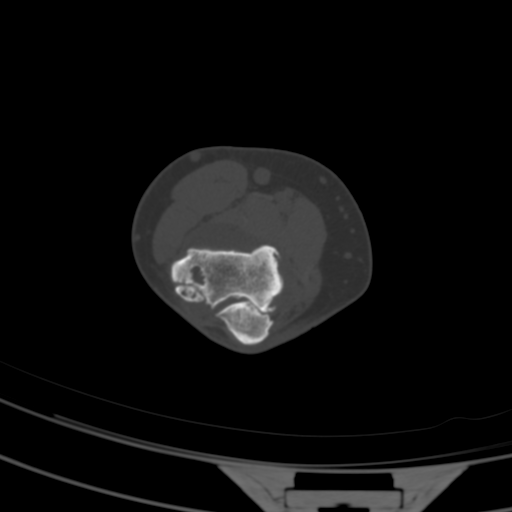
[im 142/283  soft-tissue]
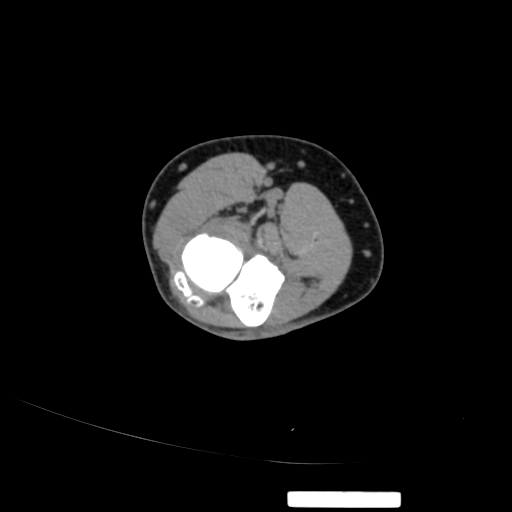
[im 142/283  bone]
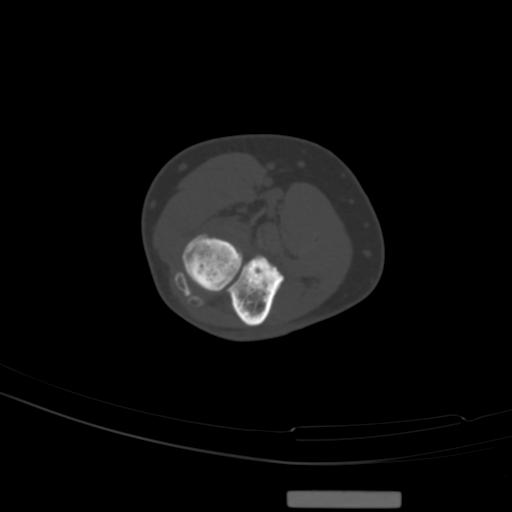
[im 170/283  bone]
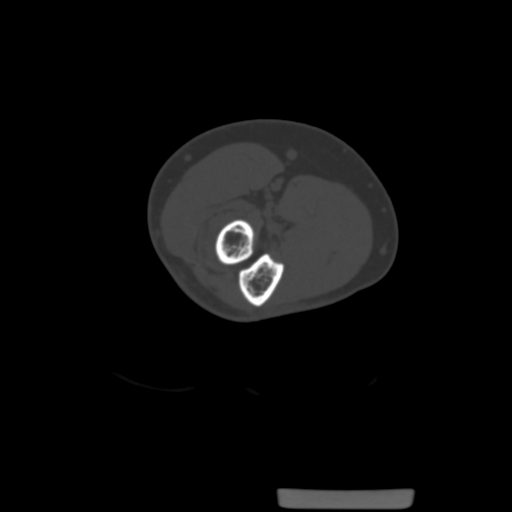
[im 198/283  bone]
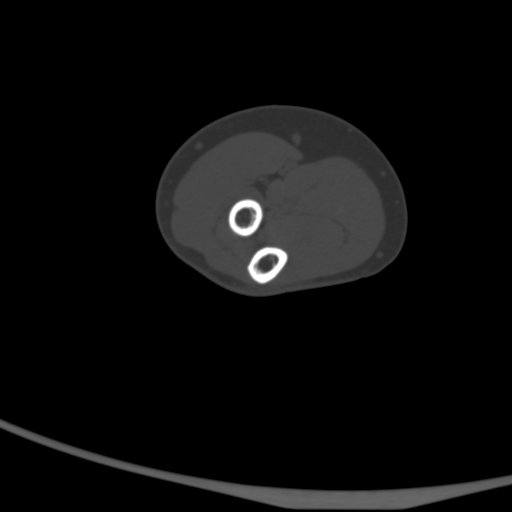
[im 226/283  bone]
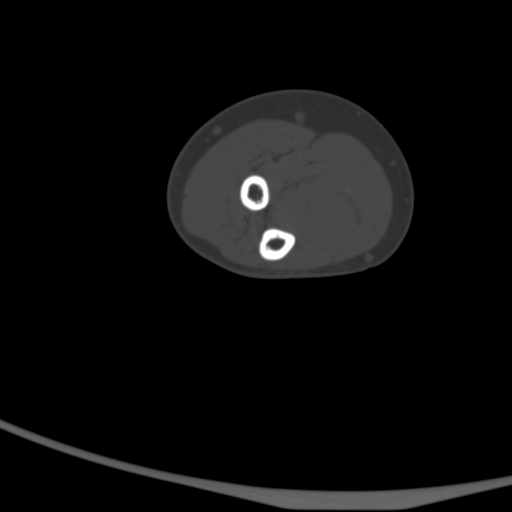
[im 254/283  soft-tissue]
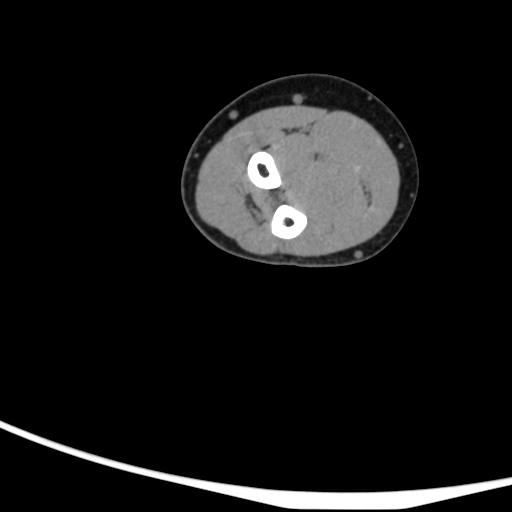
[im 254/283  bone]
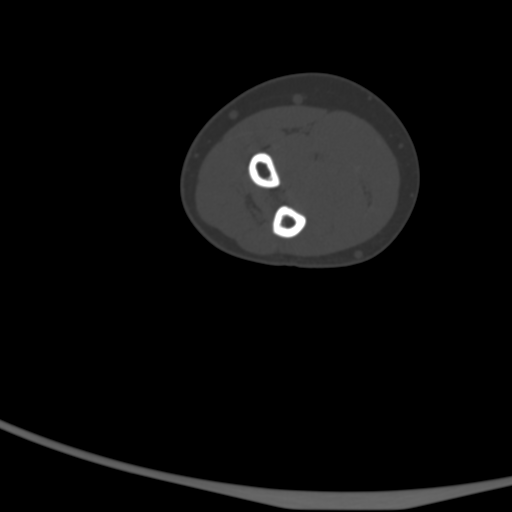

[9 of 14 positions shown; findings below may reference images not displayed]

FINDINGS: Bones/Joint/Cartilage

No fracture or dislocation. Normal alignment.

Severe osteoarthritis of the radiocapitellar joint and ulnohumeral
joint with marginal osteophytes, subchondral cystic changes and
subchondral sclerosis. Nondisplaced large osteochondral lesion
involving the posterior aspect of the capitellum measuring 13 x 16
mm without displacement. Multiple other loose bodies in the
posterior peripheral aspect of the radiocapitellar joint. Small
loose body along the anterior aspect of the ulnohumeral joint. Large
joint effusion.

Ligaments

Ligaments are suboptimally evaluated by CT.

Muscles and Tendons
Muscles are normal. No muscle atrophy. Biceps and triceps tendons
are intact.

Soft tissue
No fluid collection or hematoma.  No soft tissue mass.
IMPRESSION: 1. Severe osteoarthritis of the radiocapitellar joint and
ulnohumeral joint with a large joint effusion.
2. Nondisplaced large osteochondral lesion involving the posterior
aspect of the capitellum measuring 13 x 16 mm without displacement.
3. Multiple other loose bodies in the posterior peripheral aspect of
the radiocapitellar joint. Small loose body along the anterior
aspect of the ulnohumeral joint.

## 2022-03-08 ENCOUNTER — Other Ambulatory Visit: Payer: Self-pay | Admitting: Obstetrics and Gynecology

## 2022-03-08 DIAGNOSIS — R928 Other abnormal and inconclusive findings on diagnostic imaging of breast: Secondary | ICD-10-CM

## 2022-03-15 ENCOUNTER — Other Ambulatory Visit: Payer: Self-pay | Admitting: Obstetrics and Gynecology

## 2022-03-15 ENCOUNTER — Ambulatory Visit
Admission: RE | Admit: 2022-03-15 | Discharge: 2022-03-15 | Disposition: A | Discharge status: Home / Self Care | Payer: Commercial Managed Care - PPO | Source: Ambulatory Visit | Attending: Obstetrics and Gynecology | Admitting: Obstetrics and Gynecology

## 2022-03-15 ENCOUNTER — Other Ambulatory Visit: Payer: Commercial Managed Care - PPO

## 2022-03-15 DIAGNOSIS — D242 Benign neoplasm of left breast: Secondary | ICD-10-CM

## 2022-03-15 DIAGNOSIS — R928 Other abnormal and inconclusive findings on diagnostic imaging of breast: Secondary | ICD-10-CM

## 2022-03-16 ENCOUNTER — Other Ambulatory Visit: Payer: Self-pay | Admitting: Obstetrics and Gynecology

## 2022-03-16 ENCOUNTER — Ambulatory Visit
Admission: RE | Admit: 2022-03-16 | Discharge: 2022-03-16 | Disposition: A | Discharge status: Home / Self Care | Payer: Commercial Managed Care - PPO | Source: Ambulatory Visit | Attending: Obstetrics and Gynecology | Admitting: Obstetrics and Gynecology

## 2022-03-16 DIAGNOSIS — D242 Benign neoplasm of left breast: Secondary | ICD-10-CM

## 2022-08-07 ENCOUNTER — Other Ambulatory Visit: Payer: Self-pay | Admitting: Obstetrics and Gynecology

## 2022-08-07 DIAGNOSIS — N63 Unspecified lump in unspecified breast: Secondary | ICD-10-CM

## 2022-09-14 ENCOUNTER — Other Ambulatory Visit: Payer: Self-pay | Admitting: Obstetrics and Gynecology

## 2022-09-14 ENCOUNTER — Ambulatory Visit
Admission: RE | Admit: 2022-09-14 | Discharge: 2022-09-14 | Disposition: A | Discharge status: Home / Self Care | Payer: No Typology Code available for payment source | Source: Ambulatory Visit | Attending: Obstetrics and Gynecology | Admitting: Obstetrics and Gynecology

## 2022-09-14 DIAGNOSIS — N63 Unspecified lump in unspecified breast: Secondary | ICD-10-CM

## 2023-03-19 ENCOUNTER — Ambulatory Visit
Admission: RE | Admit: 2023-03-19 | Discharge: 2023-03-19 | Disposition: A | Discharge status: Home / Self Care | Payer: No Typology Code available for payment source | Source: Ambulatory Visit | Attending: Obstetrics and Gynecology | Admitting: Obstetrics and Gynecology

## 2023-03-19 ENCOUNTER — Ambulatory Visit
Admission: RE | Admit: 2023-03-19 | Discharge: 2023-03-19 | Disposition: A | Discharge status: Home / Self Care | Payer: PRIVATE HEALTH INSURANCE | Source: Ambulatory Visit | Attending: Obstetrics and Gynecology | Admitting: Obstetrics and Gynecology

## 2023-03-19 DIAGNOSIS — N63 Unspecified lump in unspecified breast: Secondary | ICD-10-CM

## 2023-08-07 ENCOUNTER — Encounter (INDEPENDENT_AMBULATORY_CARE_PROVIDER_SITE_OTHER): Payer: Self-pay | Admitting: Adult Health

## 2023-08-07 ENCOUNTER — Ambulatory Visit (INDEPENDENT_AMBULATORY_CARE_PROVIDER_SITE_OTHER): Payer: PRIVATE HEALTH INSURANCE | Admitting: Adult Health

## 2023-08-07 VITALS — BP 128/78 | HR 72 | Temp 98.1°F | Ht 64.5 in | Wt 248.0 lb

## 2023-08-07 DIAGNOSIS — I10 Essential (primary) hypertension: Secondary | ICD-10-CM

## 2023-08-07 DIAGNOSIS — Z0289 Encounter for other administrative examinations: Secondary | ICD-10-CM

## 2023-08-07 DIAGNOSIS — Z6841 Body Mass Index (BMI) 40.0 and over, adult: Secondary | ICD-10-CM | POA: Diagnosis not present

## 2023-08-07 NOTE — Progress Notes (Signed)
Office: [phone]  /  Fax: [phone]   Initial Visit  Heidi Schmitt was seen in clinic today to evaluate for obesity. She is interested in losing weight to improve overall health and reduce the risk of weight related complications. She presents today to review program treatment options, initial physical assessment, and evaluation.     She was referred by: Friend or Family  When asked what else they would like to accomplish? She states: Adopt healthier eating patterns, Improve energy levels and physical activity, Improve existing medical conditions, Reduce number of medications, Improve quality of life, and Improve appearance  Weight history: She reports weight gain with first pregnancy 15 years ago- never lost pregnancy weight  When asked how has your weight affected you? She states: Has affected self-esteem, Contributed to medical problems, Contributed to orthopedic problems or mobility issues, Having fatigue, Having poor endurance, and Problems with eating patterns  Some associated conditions: Hypertension and Prediabetes  Contributing factors: Family history of obesity, Moderate to high levels of stress, Reduced physical activity, Eating patterns, and Pregnancies  Weight promoting medications identified: None  Current nutrition plan: Other: Clean Eating  Current level of physical activity: None  Current or previous pharmacotherapy: GLP-1  Response to medication: Had side effects so it was discontinued   Past medical history includes:   Past Medical History:  Diagnosis Date   Anxiety    Asthma    Diabetes mellitus    Gestational diabetes    diet controlled   H/O varicella    Pregnancy induced hypertension    Seasonal allergies    Urticaria      Objective:   BP 128/78   Pulse 72   Temp 98.1 F (36.7 C)   Ht 5' 4.5" (1.638 m)   Wt 248 lb (112.5 kg)   SpO2 99%   BMI 41.91 kg/m  She was weighed on the bioimpedance scale: Body mass index is 41.91 kg/m.   Peak Weight: 250 , Body Fat%:47.4, Visceral Fat Rating:14, Weight trend over the last 12 months: Unchanged  General:  Alert, oriented and cooperative. Patient is in no acute distress.  Respiratory: Normal respiratory effort, no problems with respiration noted   Gait: able to ambulate independently  Mental Status: Normal mood and affect. Normal behavior. Normal judgment and thought content.   DIAGNOSTIC DATA REVIEWED:  BMET    Component Value Date/Time   NA 135 07/03/2012 1820   K 4.2 07/03/2012 1820   CL 101 07/03/2012 1820   CO2 19 07/03/2012 1820   GLUCOSE 73 07/03/2012 1820   BUN 7 07/03/2012 1820   CREATININE 0.59 07/03/2012 1820   CALCIUM 9.1 07/03/2012 1820   GFRNONAA >90 07/03/2012 1820   GFRAA >90 07/03/2012 1820   No results found for: "HGBA1C" No results found for: "INSULIN" CBC    Component Value Date/Time   WBC 11.7 (H) 07/05/2012 0510   RBC 3.74 (L) 07/05/2012 0510   HGB 10.7 (L) 07/05/2012 0510   HGB 14.5 12/11/2011 0000   HCT 32.3 (L) 07/05/2012 0510   HCT 43 12/11/2011 0000   PLT 150 07/05/2012 0510   PLT 258 12/11/2011 0000   MCV 86.4 07/05/2012 0510   MCH 28.6 07/05/2012 0510   MCHC 33.1 07/05/2012 0510   RDW 13.6 07/05/2012 0510   Iron/TIBC/Ferritin/ %Sat No results found for: "IRON", "TIBC", "FERRITIN", "IRONPCTSAT" Lipid Panel  No results found for: "CHOL", "TRIG", "HDL", "CHOLHDL", "VLDL", "LDLCALC", "LDLDIRECT" Hepatic Function Panel     Component Value Date/Time  PROT 6.1 07/03/2012 1820   ALBUMIN 2.9 (L) 07/03/2012 1820   AST 27 07/03/2012 1820   ALT 12 07/03/2012 1820   ALKPHOS 186 (H) 07/03/2012 1820   BILITOT 0.4 07/03/2012 1820   No results found for: "TSH"   Assessment and Plan:   Hypertension, unspecified type  Morbid obesity (HCC), Starting BMI 41.93  ESTABLISH AT [facility]   Obesity Treatment / Action Plan:  Patient will work on garnering support from family and friends to begin weight loss journey. Will work on  eliminating or reducing the presence of highly palatable, calorie dense foods in the home. Will complete provided nutritional and psychosocial assessment questionnaire before the next appointment. Will be scheduled for indirect calorimetry to determine resting energy expenditure in a fasting state.  This will allow [place] to create a reduced calorie, high-protein meal plan to promote loss of fat mass while preserving muscle mass. Counseled on the health benefits of losing 5%-15% of total body weight. Was counseled on nutritional approaches to weight loss and benefits of reducing processed foods and consuming plant-based foods and high quality protein as part of nutritional weight management. Was counseled on pharmacotherapy and role as an adjunct in weight management.   Obesity Education Performed Today:  She was weighed on the bioimpedance scale and results were discussed and documented in the synopsis.  We discussed obesity as a disease and the importance of a more detailed evaluation of all the factors contributing to the disease.  We discussed the importance of long term lifestyle changes which include nutrition, exercise and behavioral modifications as well as the importance of customizing this to her specific health and social needs.  We discussed the benefits of reaching a healthier weight to alleviate the symptoms of existing conditions and reduce the risks of the biomechanical, metabolic and psychological effects of obesity.  Illene Bolus appears to be in the action stage of change and states they are ready to start intensive lifestyle modifications and behavioral modifications.  30 minutes was spent today on this visit including the above counseling, pre-visit chart review, and post-visit documentation.  Reviewed by clinician on day of visit: allergies, medications, problem list, medical history, surgical history, family history, social history, and previous encounter notes pertinent to  obesity diagnosis.  Dexter Signor d. Jayvion Stefanski, NP-C

## 2023-09-19 ENCOUNTER — Ambulatory Visit (INDEPENDENT_AMBULATORY_CARE_PROVIDER_SITE_OTHER): Payer: PRIVATE HEALTH INSURANCE | Admitting: Family Medicine

## 2023-09-19 ENCOUNTER — Encounter (INDEPENDENT_AMBULATORY_CARE_PROVIDER_SITE_OTHER): Payer: Self-pay | Admitting: Family Medicine

## 2023-09-19 VITALS — BP 124/79 | HR 75 | Temp 98.2°F | Ht 65.0 in | Wt 250.0 lb

## 2023-09-19 DIAGNOSIS — I10 Essential (primary) hypertension: Secondary | ICD-10-CM | POA: Diagnosis not present

## 2023-09-19 DIAGNOSIS — E559 Vitamin D deficiency, unspecified: Secondary | ICD-10-CM

## 2023-09-19 DIAGNOSIS — Z1331 Encounter for screening for depression: Secondary | ICD-10-CM | POA: Diagnosis not present

## 2023-09-19 DIAGNOSIS — E785 Hyperlipidemia, unspecified: Secondary | ICD-10-CM | POA: Diagnosis not present

## 2023-09-19 DIAGNOSIS — R0602 Shortness of breath: Secondary | ICD-10-CM | POA: Diagnosis not present

## 2023-09-19 DIAGNOSIS — G4733 Obstructive sleep apnea (adult) (pediatric): Secondary | ICD-10-CM

## 2023-09-19 DIAGNOSIS — E782 Mixed hyperlipidemia: Secondary | ICD-10-CM

## 2023-09-19 DIAGNOSIS — Z6841 Body Mass Index (BMI) 40.0 and over, adult: Secondary | ICD-10-CM

## 2023-09-19 DIAGNOSIS — R5383 Other fatigue: Secondary | ICD-10-CM | POA: Diagnosis not present

## 2023-09-19 DIAGNOSIS — R7303 Prediabetes: Secondary | ICD-10-CM

## 2023-09-19 NOTE — Assessment & Plan Note (Signed)
 Last level in Epic at 20.  She is on OTC.  Vitamin D level today.

## 2023-09-19 NOTE — Assessment & Plan Note (Signed)
 Labile levels over the last few years.  Tried a statin for a while but had significant knee pain and so stopped.

## 2023-09-19 NOTE — Progress Notes (Signed)
 Chief Complaint:  Obesity   Subjective:  Heidi Schmitt (MR# [id number]) is a 43 y.o. female who presents for evaluation and treatment of obesity and related comorbidities.   Heidi Schmitt is currently in the action stage of change and ready to dedicate time achieving and maintaining a healthier weight. Heidi Schmitt is interested in becoming our patient and working on intensive lifestyle modifications including (but not limited to) diet and exercise for weight loss.  Patient was referred here by a friend.  She grew up with food instability with her mother and food availability at her grandparents.  She started gaining weight after she stopped smoking and drinking pepsi.  She has changed what is available in her pantry.  She has spoken with a friend who is a nutritionist about food changes she needs to make.   Patient is a [employer] and works from home.  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  Works 40 hours- M-F 8-5.  Lives at home with husband, daughter, son, mother.  Family is supportive of her, they eat meals together and they will all be eating healthier with her. Her husband has been on Wegovy  for over a year and has lost 80lbs.  Desired weight is 195lbs- last time she was that weight was before she was pregnant with her first child.  She mentions her weight has never been the same since. Eats out 2-3 times a week at [employer] chicken sandwich, no fries and sweet tea.  May eat at [employer] sub.  She skips breakfast and sometimes lunch- she will opt for creamer with some coffee.  Food Recall: Glass [employer] coffee drink.  Lunch is leftovers- flank steak, cherry tomatoes and mozzarella with balsamic glaze and then broccoli and cheese.  Dinner is meat and 1 vegetable.  Used to be potatoes but now has cut potatoes out.    Heidi Schmitt has been struggling with her weight. She has been unsuccessful in either losing weight, maintaining weight loss, or reaching her healthy weight  goal.   Indirect Calorimeter completed today shows a 2102. Her calculated basal metabolic rate is 8147 thus her basal metabolic rate is better than expected.  Other Fatigue Heidi Schmitt admits to daytime somnolence and admits to waking up still tired. Patient has a history of symptoms of sleep apnea and uses a CPAP. Heidi Schmitt generally gets 8 or 9 hours of sleep per night, and states that she has difficulty falling asleep. Snoring is present. Apneic episodes is present. Epworth Sleepiness Score is 2.   Shortness of Breath Heidi Schmitt notes increasing shortness of breath with exercising and seems to be worsening over time with weight gain. She notes getting out of breath sooner with activity than she used to. This has gotten worse recently. Heidi Schmitt denies shortness of breath at rest or orthopnea.  Depression Screen Heidi Schmitt's Food and Mood (modified PHQ-9) score was 15.      No data to display           Objective:  Vitals Temp: 98.2 F (36.8 C) BP: 124/79 Pulse Rate: 75 SpO2: 99 %   Anthropometric Measurements Height: 5' 5 (1.651 m) Weight: 250 lb (113.4 kg) BMI (Calculated): 41.6 Starting Weight: 250 lb Peak Weight: 250 lb Waist Measurement : 50 inches   Body Composition  Body Fat %: 48.1 % Fat Mass (lbs): 120.4 lbs Muscle Mass (lbs): 123.2 lbs Total Body Water (lbs): 92.2 lbs Visceral Fat Rating : 14   Other Clinical Data Fasting: yes  Labs: yes Today's Visit #: 1 Starting Date: 09/19/23    EKG: Normal sinus rhythm, rate 67bpm.  General: Cooperative, alert, well developed, in no acute distress. HEENT: Conjunctivae and lids unremarkable. Cardiovascular: Regular rhythm.  Lungs: Normal work of breathing. Neurologic: No focal deficits.   Lab Results  Component Value Date   CREATININE 0.59 07/03/2012   BUN 7 07/03/2012   NA 135 07/03/2012   K 4.2 07/03/2012   CL 101 07/03/2012   CO2 19 07/03/2012   Lab Results  Component Value Date   ALT 12 07/03/2012   AST 27  07/03/2012   ALKPHOS 186 (H) 07/03/2012   BILITOT 0.4 07/03/2012   No results found for: HGBA1C No results found for: INSULIN  No results found for: TSH No results found for: CHOL, HDL, LDLCALC, LDLDIRECT, TRIG, CHOLHDL Lab Results  Component Value Date   WBC 11.7 (H) 07/05/2012   HGB 10.7 (L) 07/05/2012   HCT 32.3 (L) 07/05/2012   MCV 86.4 07/05/2012   PLT 150 07/05/2012   No results found for: IRON, TIBC, FERRITIN  Assessment and Plan:   Other Fatigue  Heidi Schmitt does feel that her weight is causing her energy to be lower than it should be. Fatigue may be related to obesity, depression or many other causes. Labs will be ordered, and in the meanwhile, Heidi Schmitt will focus on self care including making healthy food choices, increasing physical activity and focusing on stress reduction.  Shortness of Breath  Heidi Schmitt does feel that she gets out of breath more easily that she used to when she exercises. 's shortness of breath appears to be obesity related and exercise induced. She has agreed to work on weight loss and gradually increase exercise to treat her exercise induced shortness of breath. Will continue to monitor closely.  Heidi Schmitt had a positive depression screening. Depression is commonly associated with obesity and often results in emotional eating behaviors. We will monitor this closely and work on CBT to help improve the non-hunger eating patterns. Referral to Psychology may be required if no improvement is seen as she continues in our clinic.    Problem List Items Addressed This Visit       Cardiovascular and Mediastinum   Hypertension   Diagnosed during her 1st pregnancy with preeclampsia.  Has never been off medication since that first pregnancy.  She had pre eclampsia in her second pregnancy as well.  She is currently on losartan  50mg  (had cough with lisinopril).  CMP today        Respiratory   OSA (obstructive sleep apnea)   Diagnosed in 2015.  Has a  CPAP and uses it nightly.  She has recently turned her pressure down due to her recent ENT surgery.        Other   Prediabetes   Highest A1c of 6.2 on review of labs.  She believes that her last A1c of 6.1 was in September.  She has never been on medication. A1c, Insulin , and CMP ordered today.      Relevant Orders   Comprehensive metabolic panel   Hemoglobin A1c   Insulin , random   Hyperlipidemia   Labile levels over the last few years.  Tried a statin for a while but had significant knee pain and so stopped.       Relevant Orders   Lipid Panel With LDL/HDL Ratio   Vitamin D  deficiency   Last level in Epic at 20.  She is on OTC.  Vitamin D  level today.  Relevant Orders   VITAMIN D  25 Hydroxy (Vit-D Deficiency, Fractures)   Other Visit Diagnoses       Other fatigue    -  Primary   Relevant Orders   EKG 12-Lead (Completed)   Vitamin B12   T4, free   T3   Folate   TSH     SOBOE (shortness of breath on exertion)       Relevant Orders   CBC with Differential/Platelet     BMI 40.0-44.9, adult (HCC)         Morbid obesity (HCC), Starting BMI 41.93           Heidi Schmitt is currently in the action stage of change and her goal is to continue with weight loss efforts. I recommend Heidi Schmitt begin the structured treatment plan as follows:  She has agreed to Category 4 Plan  Exercise goals: No exercise has been prescribed at this time.  Behavioral modification strategies:increasing lean protein intake, increasing vegetables, decrease liquid calories, no skipping meals, meal planning and cooking strategies, and avoiding temptations  She was informed of the importance of frequent follow-up visits to maximize her success with intensive lifestyle modifications for her multiple health conditions. She was informed we would discuss her lab results at her next visit unless there is a critical issue that needs to be addressed sooner. Heidi Schmitt agreed to keep her next visit at the agreed upon  time to discuss these results.   Attestation Statements:  Reviewed by clinician on day of visit: allergies, medications, problem list, medical history, surgical history, family history, social history, and previous encounter notes. This is the patient's first visit at [facility]. The patient's NEW PATIENT PACKET was reviewed at length. Included in the packet: current and past health history, medications, allergies, ROS, gynecologic history (women only), surgical history, family history, social history, weight history, weight loss surgery history (for those that have had weight loss surgery), nutritional evaluation, mood and food questionnaire, PHQ9, Epworth questionnaire, sleep habits questionnaire, patient life and health improvement goals questionnaire. These will all be scanned into the patient's chart under media.   During the visit, I independently reviewed the patient's EKG, bioimpedance scale results, and indirect calorimeter results. I used this information to tailor a meal plan for the patient that will help her to lose weight and will improve her obesity-related conditions going forward. I performed a medically necessary appropriate examination and/or evaluation. I discussed the assessment and treatment plan with the patient. The patient was provided an opportunity to ask questions and all were answered. The patient agreed with the plan and demonstrated an understanding of the instructions. Labs were ordered at this visit and will be reviewed at the next visit unless more critical results need to be addressed immediately. Clinical information was updated and documented in the EMR.    Time spent on visit including pre-visit chart review and post-visit charting and care was 50 minutes.   Adelita Cho, MDAlexandria Cho, MD

## 2023-09-19 NOTE — Assessment & Plan Note (Signed)
 Highest A1c of 6.2 on review of labs.  She believes that her last A1c of 6.1 was in September.  She has never been on medication. A1c, Insulin, and CMP ordered today.

## 2023-09-19 NOTE — Assessment & Plan Note (Signed)
 Diagnosed during her 1st pregnancy with preeclampsia.  Has never been off medication since that first pregnancy.  She had pre eclampsia in her second pregnancy as well.  She is currently on losartan 50mg  (had cough with lisinopril).  CMP today

## 2023-09-19 NOTE — Assessment & Plan Note (Signed)
 Diagnosed in 2015.  Has a CPAP and uses it nightly.  She has recently turned her pressure down due to her recent ENT surgery.

## 2023-09-20 ENCOUNTER — Encounter (INDEPENDENT_AMBULATORY_CARE_PROVIDER_SITE_OTHER): Payer: Self-pay | Admitting: Family Medicine

## 2023-09-25 LAB — COMPREHENSIVE METABOLIC PANEL
ALT: 37 [IU]/L — ABNORMAL HIGH (ref 0–32)
AST: 23 [IU]/L (ref 0–40)
Albumin: 4.5 g/dL (ref 3.9–4.9)
Alkaline Phosphatase: 137 [IU]/L — ABNORMAL HIGH (ref 44–121)
BUN/Creatinine Ratio: 14 (ref 9–23)
BUN: 10 mg/dL (ref 6–24)
Bilirubin Total: 0.4 mg/dL (ref 0.0–1.2)
CO2: 20 mmol/L (ref 20–29)
Calcium: 9.9 mg/dL (ref 8.7–10.2)
Chloride: 105 mmol/L (ref 96–106)
Creatinine, Ser: 0.74 mg/dL (ref 0.57–1.00)
Globulin, Total: 2.4 g/dL (ref 1.5–4.5)
Glucose: 112 mg/dL — ABNORMAL HIGH (ref 70–99)
Potassium: 4.4 mmol/L (ref 3.5–5.2)
Sodium: 139 mmol/L (ref 134–144)
Total Protein: 6.9 g/dL (ref 6.0–8.5)
eGFR: 103 mL/min/{1.73_m2} (ref >59)

## 2023-09-25 LAB — CBC WITH DIFFERENTIAL/PLATELET
Basophils Absolute: 0 10*3/uL (ref 0.0–0.2)
Basos: 0 %
EOS (ABSOLUTE): 0.1 10*3/uL (ref 0.0–0.4)
Eos: 2 %
Hematocrit: 44.1 % (ref 34.0–46.6)
Hemoglobin: 14.7 g/dL (ref 11.1–15.9)
Immature Grans (Abs): 0 10*3/uL (ref 0.0–0.1)
Immature Granulocytes: 0 %
Lymphocytes Absolute: 2.1 10*3/uL (ref 0.7–3.1)
Lymphs: 33 %
MCH: 29.2 pg (ref 26.6–33.0)
MCHC: 33.3 g/dL (ref 31.5–35.7)
MCV: 88 fL (ref 79–97)
Monocytes Absolute: 0.4 10*3/uL (ref 0.1–0.9)
Monocytes: 7 %
Neutrophils Absolute: 3.7 10*3/uL (ref 1.4–7.0)
Neutrophils: 58 %
Platelets: 289 10*3/uL (ref 150–450)
RBC: 5.03 x10E6/uL (ref 3.77–5.28)
RDW: 12.5 % (ref 11.7–15.4)
WBC: 6.4 10*3/uL (ref 3.4–10.8)

## 2023-09-25 LAB — HEMOGLOBIN A1C
Est. average glucose Bld gHb Est-mCnc: 134 mg/dL
Hgb A1c MFr Bld: 6.3 % — ABNORMAL HIGH (ref 4.8–5.6)

## 2023-09-25 LAB — TSH: TSH: 2.42 u[IU]/mL (ref 0.450–4.500)

## 2023-09-25 LAB — VITAMIN B12: Vitamin B-12: 469 pg/mL (ref 232–1245)

## 2023-09-25 LAB — VITAMIN D 25 HYDROXY (VIT D DEFICIENCY, FRACTURES): Vit D, 25-Hydroxy: 35.3 ng/mL (ref 30.0–100.0)

## 2023-09-25 LAB — LIPID PANEL WITH LDL/HDL RATIO
Cholesterol, Total: 235 mg/dL — ABNORMAL HIGH (ref 100–199)
HDL: 43 mg/dL (ref >39)
LDL Chol Calc (NIH): 151 mg/dL — ABNORMAL HIGH (ref 0–99)
LDL/HDL Ratio: 3.5 {ratio} — ABNORMAL HIGH (ref 0.0–3.2)
Triglycerides: 223 mg/dL — ABNORMAL HIGH (ref 0–149)
VLDL Cholesterol Cal: 41 mg/dL — ABNORMAL HIGH (ref 5–40)

## 2023-09-25 LAB — FOLATE: Folate: 3.4 ng/mL (ref >3.0)

## 2023-09-25 LAB — INSULIN, RANDOM: INSULIN: 33.5 u[IU]/mL — ABNORMAL HIGH (ref 2.6–24.9)

## 2023-09-25 LAB — T3: T3, Total: 147 ng/dL (ref 71–180)

## 2023-09-25 LAB — T4, FREE: Free T4: 0.84 ng/dL (ref 0.82–1.77)

## 2023-10-03 ENCOUNTER — Encounter (INDEPENDENT_AMBULATORY_CARE_PROVIDER_SITE_OTHER): Payer: Self-pay | Admitting: Family Medicine

## 2023-10-03 ENCOUNTER — Ambulatory Visit (INDEPENDENT_AMBULATORY_CARE_PROVIDER_SITE_OTHER): Payer: PRIVATE HEALTH INSURANCE | Admitting: Family Medicine

## 2023-10-03 VITALS — BP 123/80 | HR 74 | Temp 98.4°F | Ht 65.0 in | Wt 248.0 lb

## 2023-10-03 DIAGNOSIS — R7401 Elevation of levels of liver transaminase levels: Secondary | ICD-10-CM | POA: Diagnosis not present

## 2023-10-03 DIAGNOSIS — E785 Hyperlipidemia, unspecified: Secondary | ICD-10-CM

## 2023-10-03 DIAGNOSIS — E782 Mixed hyperlipidemia: Secondary | ICD-10-CM

## 2023-10-03 DIAGNOSIS — E559 Vitamin D deficiency, unspecified: Secondary | ICD-10-CM | POA: Diagnosis not present

## 2023-10-03 DIAGNOSIS — R7303 Prediabetes: Secondary | ICD-10-CM

## 2023-10-03 DIAGNOSIS — Z6841 Body Mass Index (BMI) 40.0 and over, adult: Secondary | ICD-10-CM

## 2023-10-03 MED ORDER — METFORMIN HCL ER 500 MG PO TB24: 500.0000 mg | ORAL_TABLET | Freq: Every day | ORAL | 0 refills | Status: DC

## 2023-10-03 NOTE — Assessment & Plan Note (Signed)
 Discussed importance of vitamin d supplementation.  Vitamin d supplementation has been shown to decrease fatigue, decrease risk of progression to insulin resistance and then prediabetes, decreases risk of falling in older age and can even assist in decreasing depressive symptoms in PTSD.   Prescription for Vitamin D sent in.

## 2023-10-03 NOTE — Assessment & Plan Note (Addendum)
Patient Alkaline phosphatase and ALT elevated on initial blood work.  We discussed possible causes and pathophysiology of hepatic steatosis which is the most likely cause.  Will continue to work on nutrition and repeat labs In 3-4 months.

## 2023-10-03 NOTE — Assessment & Plan Note (Signed)
The 10-year ASCVD risk score (Arnett DK, et al., 2019) is: 1.8%   Values used to calculate the score:     Age: 43 years     Sex: Female     Is Non-Hispanic African American: No     Diabetic: No     Tobacco smoker: No     Systolic Blood Pressure: 123 mmHg     Is BP treated: Yes     HDL Cholesterol: 43 mg/dL     Total Cholesterol: 235 mg/dL

## 2023-10-03 NOTE — Assessment & Plan Note (Addendum)
Starting weight: 250 on 09/19/23 Peak weight: 250 BMR: 2102 Previous obesity management: GLP-1 medications previously but has significant GI side effects Body Fat %: 47.4% Starting Meal Plan: Category 4 Meal Plan needs: minimal processed food options

## 2023-10-03 NOTE — Assessment & Plan Note (Addendum)
Pathophysiology of progression through insulin resistance to prediabetes and diabetes was discussed at length today.  Patient to continue to monitor and be in control of total intake of snack calories which may be simple carbohydrates but should be consumed only after the patient has taken in all the nutrition for the day.  Macronutrient identification, classification and daily intake ratios were discussed.  Plan to repeat labs in 3 months to monitor both hemoglobin A1c and insulin levels.  Patient voices interest in metformin given her elevated A1c and insulin levels.  We discussed risks and benefits and through shared decision making we decided to start metformin.

## 2023-10-03 NOTE — Progress Notes (Signed)
SUBJECTIVE:  Chief Complaint: Obesity  Interim History: Patient mentions she is struggling to get all the food in.  She feels frustrated with lack of weight loss.  She wants to make some substitutions.  She has not cut the sugar out completely.  She is monitoring her intake of sugary beverages.  Wants to make a swap out from the greek yogurt for the chobani drinks.  Doesn't want to do a microwave meal and wants other lunch meals.   Anavey is here to discuss her progress with her obesity treatment plan. She is on the Category 4 Plan and states she is following her eating plan approximately 90 % of the time. She states she is not exercising.    OBJECTIVE: Visit Diagnoses: Problem List Items Addressed This Visit       Other   Prediabetes   Pathophysiology of progression through insulin resistance to prediabetes and diabetes was discussed at length today.  Patient to continue to monitor and be in control of total intake of snack calories which may be simple carbohydrates but should be consumed only after the patient has taken in all the nutrition for the day.  Macronutrient identification, classification and daily intake ratios were discussed.  Plan to repeat labs in 3 months to monitor both hemoglobin A1c and insulin levels.  Patient voices interest in metformin given her elevated A1c and insulin levels.  We discussed risks and benefits and through shared decision making we decided to start metformin.       Relevant Medications   metFORMIN (GLUCOPHAGE-XR) 500 MG 24 hr tablet   Hyperlipidemia - Primary   The 10-year ASCVD risk score (Arnett DK, et al., 2019) is: 1.8%   Values used to calculate the score:     Age: 43 years     Sex: Female     Is Non-Hispanic African American: No     Diabetic: No     Tobacco smoker: No     Systolic Blood Pressure: 123 mmHg     Is BP treated: Yes     HDL Cholesterol: 43 mg/dL     Total Cholesterol: 235 mg/dL       Vitamin D deficiency   Discussed  importance of vitamin d supplementation.  Vitamin d supplementation has been shown to decrease fatigue, decrease risk of progression to insulin resistance and then prediabetes, decreases risk of falling in older age and can even assist in decreasing depressive symptoms in PTSD.   Prescription for Vitamin D sent in.        Transaminitis   Patient Alkaline phosphatase and ALT elevated on initial blood work.  We discussed possible causes and pathophysiology of hepatic steatosis which is the most likely cause.  Will continue to work on nutrition and repeat labs In 3-4 months.      Morbid obesity (HCC)   Starting weight: 250 on 09/19/23 Peak weight: 250 BMR: 2102 Previous obesity management: GLP-1 medications previously Body Fat %: 47.4% Starting Meal Plan: Category 4 Meal Plan needs: minimal processed food options Likes/ Dislikes of meal plan:        Relevant Medications   metFORMIN (GLUCOPHAGE-XR) 500 MG 24 hr tablet   Other Visit Diagnoses       BMI 40.0-44.9, adult (HCC)       Relevant Medications   metFORMIN (GLUCOPHAGE-XR) 500 MG 24 hr tablet       No data recorded No data recorded No data recorded No data recorded   ASSESSMENT AND PLAN:  Diet: Suad is currently in the action stage of change. As such, her goal is to continue with weight loss efforts. She has agreed to Category 4 Plan.  Exercise: Phylis has been instructed that some exercise is better than none for weight loss and overall health benefits.   Behavior Modification:  We discussed the following Behavioral Modification Strategies today: increasing lean protein intake, increasing vegetables, meal planning and cooking strategies, and planning for success.   No follow-ups on file.Marland Kitchen She was informed of the importance of frequent follow up visits to maximize her success with intensive lifestyle modifications for her multiple health conditions.  Attestation Statements:   Reviewed by clinician on day of  visit: allergies, medications, problem list, medical history, surgical history, family history, social history, and previous encounter notes.   Time spent on visit including pre-visit chart review and post-visit care and charting was 45 minutes.    Reuben Likes, MD

## 2023-10-09 ENCOUNTER — Encounter (INDEPENDENT_AMBULATORY_CARE_PROVIDER_SITE_OTHER): Payer: Self-pay | Admitting: Family Medicine

## 2023-10-10 ENCOUNTER — Encounter (HOSPITAL_BASED_OUTPATIENT_CLINIC_OR_DEPARTMENT_OTHER): Payer: Self-pay | Admitting: Emergency Medicine

## 2023-10-10 ENCOUNTER — Ambulatory Visit (INDEPENDENT_AMBULATORY_CARE_PROVIDER_SITE_OTHER)
Admit: 2023-10-10 | Discharge: 2023-10-10 | Disposition: A | Discharge status: Home / Self Care | Payer: PRIVATE HEALTH INSURANCE | Attending: Internal Medicine | Admitting: Internal Medicine

## 2023-10-10 ENCOUNTER — Ambulatory Visit (HOSPITAL_BASED_OUTPATIENT_CLINIC_OR_DEPARTMENT_OTHER)
Admission: EM | Admit: 2023-10-10 | Discharge: 2023-10-10 | Disposition: A | Discharge status: Home / Self Care | Payer: PRIVATE HEALTH INSURANCE | Attending: Family Medicine | Admitting: Family Medicine

## 2023-10-10 DIAGNOSIS — R059 Cough, unspecified: Secondary | ICD-10-CM | POA: Diagnosis not present

## 2023-10-10 DIAGNOSIS — R051 Acute cough: Secondary | ICD-10-CM

## 2023-10-10 DIAGNOSIS — J101 Influenza due to other identified influenza virus with other respiratory manifestations: Secondary | ICD-10-CM | POA: Diagnosis not present

## 2023-10-10 DIAGNOSIS — R52 Pain, unspecified: Secondary | ICD-10-CM

## 2023-10-10 LAB — POCT INFLUENZA A/B: Influenza A, POC: POSITIVE — AB

## 2023-10-10 MED ORDER — PREDNISONE 20 MG PO TABS: 20.0000 mg | ORAL_TABLET | Freq: Every day | ORAL | 0 refills | Status: AC

## 2023-10-10 MED ORDER — PROMETHAZINE-DM 6.25-15 MG/5ML PO SYRP: 5.0000 mL | ORAL_SOLUTION | Freq: Four times a day (QID) | ORAL | 0 refills | Status: DC | PRN

## 2023-10-10 NOTE — ED Triage Notes (Signed)
Pt had cough and congestion since Monday. Has ear implant that got in December and can't take the coughing and pressure. Reports tried OTC meds without relief. Reports hx PNA. This morning was able to get up little phlegm. Zinc, Lysine, bronchial syrup, oscill beads, Sudafed, Guaifenesin, Benadryl and Tylneol all medications tried.

## 2023-10-10 NOTE — Discharge Instructions (Addendum)
Influenza type a positive.  Chest x-ray negative, no pneumonia nor acute disease.  Patient feels like she did not do well the 1 time she took Tamiflu.  She has zinc and other homeopathic antivirals at home that she will use.  Lots of fluids, plenty of rest.  Acetaminophen or ibuprofen, every 4 hours, as needed for body aches or fever.  Promethazine DM, 5 mL, every 6 hours, as needed for cough.  Promethazine DM could make you drowsy, do not use and drive.  Prednisone, 20 mg, 1 daily for 6 days.  Follow-up if symptoms do not improve, worsen or new symptoms occur.

## 2023-10-10 NOTE — ED Provider Notes (Signed)
[facility]    CSN: [id number] Arrival date & time: 10/10/23  0801      History   Chief Complaint Chief Complaint  Patient presents with   Cough    HPI Heidi Schmitt is a 43 y.o. female.   Here with complaint of cough and congestion since Monday. Had Right Tympanoplasty and bilateral PE Tube Insertion on 08/31/23.  The coughing is causing intolerable ear pressure.  Has Asthma and history of Pneumonia and Asthma Exacerbations/Bronchitis.  Reports tried OTC meds without relief. Reports hx PNA. This morning was able to get up little phlegm. Zinc, Lysine, bronchial syrup, oscill beads, Sudafed, Guaifenesin, Benadryl and Tylneol all medications tried.      Cough Associated symptoms: ear pain, rhinorrhea and wheezing   Associated symptoms: no chest pain, no chills, no fever, no rash, no shortness of breath and no sore throat     Past Medical History:  Diagnosis Date   Anxiety    Asthma    Depression    Diabetes mellitus    Gallbladder problem    Gestational diabetes    diet controlled   H/O varicella    Heartburn    HTN (hypertension)    Joint pain    Multiple food allergies    OA (osteoarthritis)    Prediabetes    Pregnancy induced hypertension    Seasonal allergies    Sleep apnea    SOB (shortness of breath)    Urticaria    Vitamin D deficiency     Patient Active Problem List   Diagnosis Date Noted   Transaminitis 10/03/2023   Morbid obesity (HCC) 10/03/2023   Prediabetes 09/19/2023   Hypertension 09/19/2023   OSA (obstructive sleep apnea) 09/19/2023   Hyperlipidemia 09/19/2023   Vitamin D deficiency 09/19/2023    Past Surgical History:  Procedure Laterality Date   CESAREAN SECTION  07/04/2012   Procedure: CESAREAN SECTION;  Surgeon: Mitchel Honour, DO;  Location: [facility];  Service: Obstetrics;  Laterality: N/A;  Primary cesarean section with delivery of baby boy at 0702. Apgars7/9.   CHOLECYSTECTOMY  08/2015   SEPTOPLASTY  05/2015    Septoplasty and adenoidectomy   TONSILLECTOMY     TYMPANOPLASTY      OB History     Gravida  2   Para  2   Term  2   Preterm  0   AB  0   Living  2      SAB  0   IAB  0   Ectopic  0   Multiple  0   Live Births  2            Home Medications    Prior to Admission medications   Medication Sig Start Date End Date Taking? Authorizing Provider  predniSONE (DELTASONE) 20 MG tablet Take 1 tablet (20 mg total) by mouth daily with breakfast for 6 days. 10/10/23 10/16/23 Yes Prescilla Sours, FNP  promethazine-dextromethorphan (PROMETHAZINE-DM) 6.25-15 MG/5ML syrup Take 5 mLs by mouth 4 (four) times daily as needed for cough. 10/10/23  Yes Prescilla Sours, FNP  acetaminophen (TYLENOL) 500 MG tablet Take 500 mg by mouth every 6 (six) hours as needed.    [provider]  albuterol (VENTOLIN HFA) 108 (90 Base) MCG/ACT inhaler 2 puffs every 4-6 hours as needed 12/01/20   Kozlow, Alvira Philips, MD  B Complex Vitamins (B COMPLEX PO) Take by mouth.    [provider]  BERBERINE CHLORIDE PO Take 1 tablet by mouth  daily. 275mg  qd    [provider]  cyclobenzaprine (FLEXERIL) 10 MG tablet Take 10 mg by mouth 3 (three) times daily as needed for muscle spasms.    [provider]  diclofenac (VOLTAREN) 50 MG EC tablet Take 50 mg by mouth 2 (two) times daily.    [provider]  diphenhydrAMINE (BENADRYL) 25 mg capsule Take 25 mg by mouth every 6 (six) hours as needed. 25-50mg     [provider]  EPINEPHrine (AUVI-Q) 0.3 mg/0.3 mL IJ SOAJ injection Inject 0.3 mg into the muscle as needed for anaphylaxis. As directed for life-threatening allergic reactions 12/01/20   Kozlow, Alvira Philips, MD  Flaxseed, Linseed, (FLAX SEED OIL PO) Take 1 capsule by mouth daily as needed.    [provider]  levonorgestrel (MIRENA) 20 MCG/24HR IUD by Intrauterine route.    [provider]  MAGNESIUM GLYCINATE PO Take 400 mg by mouth.    [provider]  metFORMIN (GLUCOPHAGE-XR) 500 MG 24 hr tablet Take 1 tablet (500 mg total) by mouth daily with breakfast. 10/03/23   Langston Reusing, MD  montelukast (SINGULAIR) 10 MG tablet TAKE 1 TABLET BY MOUTH NIGHTLY. 02/18/19   Corum, Minerva Fester, MD  traZODone (DESYREL) 50 MG tablet Take 100 mg by mouth. 01/25/17   [provider]  TURMERIC PO Take by mouth.    [provider]    Family History Family History  Problem Relation Age of Onset   Hyperlipidemia Mother    Hypertension Mother    Asthma Mother    Sleep apnea Mother    Heart disease Father    Alcohol abuse Father    Sudden death Father    Heart disease Maternal Grandfather    Diabetes Maternal Grandfather    Hypertension Maternal Grandfather    Diabetes Maternal Aunt    Hypertension Maternal Aunt    Asthma Maternal Aunt    Birth defects Other        Angel Man syndrome   Other Neg Hx    Allergic rhinitis Neg Hx    Angioedema Neg Hx    Atopy Neg Hx    Eczema Neg Hx    Immunodeficiency Neg Hx    Urticaria Neg Hx     Social History Social History   Tobacco Use   Smoking status: Former    Current packs/day: 0.00    Types: Cigarettes    Quit date: 06/03/2005    Years since quitting: 18.3   Smokeless tobacco: Never  Substance Use Topics   Alcohol use: No   Drug use: No     Allergies   Ace inhibitors, Azithromycin, Gluten meal, and Tape   Review of Systems Review of Systems  Constitutional:  Negative for chills and fever.  HENT:  Positive for congestion, ear pain, postnasal drip and rhinorrhea. Negative for sinus pressure, sinus pain and sore throat.   Eyes:  Negative for pain and visual disturbance.  Respiratory:  Positive for cough and wheezing. Negative for shortness of breath.   Cardiovascular:  Negative for chest pain and palpitations.  Gastrointestinal:  Negative for abdominal pain, constipation, diarrhea, nausea and vomiting.  Genitourinary:  Negative for dysuria and hematuria.   Musculoskeletal:  Positive for arthralgias. Negative for back pain.  Skin:  Negative for color change and rash.  Neurological:  Negative for seizures and syncope.  All other systems reviewed and are negative.    Physical Exam Triage Vital Signs ED Triage Vitals  Encounter Vitals Group  BP 10/10/23 0820 133/83     Systolic BP Percentile --      Diastolic BP Percentile --      Pulse Rate 10/10/23 0820 96     Resp 10/10/23 0820 20     Temp 10/10/23 0820 98.2 F (36.8 C)     Temp Source 10/10/23 0820 Oral     SpO2 10/10/23 0820 98 %     Weight --      Height --      Head Circumference --      Peak Flow --      Pain Score 10/10/23 0817 5     Pain Loc --      Pain Education --      Exclude from Growth Chart --    No data found.  Updated Vital Signs BP 133/83 (BP Location: Right Arm)   Pulse 96   Temp 98.2 F (36.8 C) (Oral)   Resp 20   SpO2 98%   Visual Acuity Right Eye Distance:   Left Eye Distance:   Bilateral Distance:    Right Eye Near:   Left Eye Near:    Bilateral Near:     Physical Exam Vitals and nursing note reviewed.  Constitutional:      General: She is not in acute distress.    Appearance: She is well-developed. She is ill-appearing. She is not toxic-appearing.  HENT:     Head: Normocephalic and atraumatic.     Right Ear: Ear canal and external ear normal. A PE tube is present.     Left Ear: Ear canal and external ear normal. A PE tube is present.     Nose: Congestion and rhinorrhea present. Rhinorrhea is clear.     Right Sinus: Maxillary sinus tenderness present. No frontal sinus tenderness.     Left Sinus: Maxillary sinus tenderness present. No frontal sinus tenderness.     Mouth/Throat:     Lips: Pink.     Mouth: Mucous membranes are moist.     Pharynx: Uvula midline. No oropharyngeal exudate or posterior oropharyngeal erythema.     Tonsils: No tonsillar exudate.  Eyes:     Conjunctiva/sclera: Conjunctivae normal.     Pupils: Pupils are  equal, round, and reactive to light.  Cardiovascular:     Rate and Rhythm: Normal rate and regular rhythm.     Heart sounds: S1 normal and S2 normal. No murmur heard. Pulmonary:     Effort: Pulmonary effort is normal. No respiratory distress.     Breath sounds: Examination of the right-upper field reveals wheezing. Examination of the left-upper field reveals wheezing. Wheezing present. No decreased breath sounds, rhonchi or rales.  Abdominal:     Palpations: Abdomen is soft.     Tenderness: There is no abdominal tenderness.  Musculoskeletal:        General: No swelling.     Cervical back: Neck supple.  Lymphadenopathy:     Head:     Right side of head: No submental, submandibular, tonsillar, preauricular or posterior auricular adenopathy.     Left side of head: No submental, submandibular, tonsillar, preauricular or posterior auricular adenopathy.     Cervical: Cervical adenopathy present.     Right cervical: Superficial cervical adenopathy present.     Left cervical: Superficial cervical adenopathy present.  Skin:    General: Skin is warm and dry.     Capillary Refill: Capillary refill takes less than 2 seconds.     Findings: No rash.  Neurological:  Mental Status: She is alert and oriented to person, place, and time.  Psychiatric:        Mood and Affect: Mood normal.      UC Treatments / Results  Labs (all labs ordered are listed, but only abnormal results are displayed) Labs Reviewed  POCT INFLUENZA A/B - Abnormal; Notable for the following components:      Result Value   Influenza A, POC Positive (*)    All other components within normal limits    EKG   Radiology DG Chest 2 View Result Date: 10/10/2023 CLINICAL DATA:  Cough. EXAM: CHEST - 2 VIEW COMPARISON:  October 01, 2022. FINDINGS: The heart size and mediastinal contours are within normal limits. Both lungs are clear. The visualized skeletal structures are unremarkable. IMPRESSION: No active cardiopulmonary  disease. Electronically Signed   By: Lupita Raider M.D.   On: 10/10/2023 08:57    Procedures Procedures (including critical care time)  Medications Ordered in UC Medications - No data to display  Initial Impression / Assessment and Plan / UC Course  I have reviewed the triage vital signs and the nursing notes.  Pertinent labs & imaging results that were available during my care of the patient were reviewed by me and considered in my medical decision making (see chart for details).  Positive for flu type A.  Declined Tamiflu.  Chest x-ray negative.  Given her asthma will treat with prednisone, 20 mg, 1 daily for 6 days.  Promethazine DM, 5 mL, every 6 hours, as needed for cough.  Do not use and drive.  Get plenty of fluids and rest.  She has inhalers for her asthma.  Follow-up if symptoms do not improve, worsen or new symptoms occur.  Work excuse provided. Final Clinical Impressions(s) / UC Diagnoses   Final diagnoses:  Acute cough  Generalized body aches  Type A influenza     Discharge Instructions      Influenza type a positive.  Chest x-ray negative, no pneumonia nor acute disease.  Patient feels like she did not do well the 1 time she took Tamiflu.  She has zinc and other homeopathic antivirals at home that she will use.  Lots of fluids, plenty of rest.  Acetaminophen or ibuprofen, every 4 hours, as needed for body aches or fever.  Promethazine DM, 5 mL, every 6 hours, as needed for cough.  Promethazine DM could make you drowsy, do not use and drive.  Prednisone, 20 mg, 1 daily for 6 days.  Follow-up if symptoms do not improve, worsen or new symptoms occur.     ED Prescriptions     Medication Sig Dispense Auth. Provider   predniSONE (DELTASONE) 20 MG tablet Take 1 tablet (20 mg total) by mouth daily with breakfast for 6 days. 6 tablet Prescilla Sours, FNP   promethazine-dextromethorphan (PROMETHAZINE-DM) 6.25-15 MG/5ML syrup Take 5 mLs by mouth 4 (four) times daily as  needed for cough. 118 mL Prescilla Sours, FNP      PDMP not reviewed this encounter.   Prescilla Sours, FNP 10/10/23 [phone]

## 2023-10-10 NOTE — Telephone Encounter (Signed)
Please advise

## 2023-10-14 ENCOUNTER — Telehealth: Payer: PRIVATE HEALTH INSURANCE | Admitting: Family Medicine

## 2023-10-14 DIAGNOSIS — J069 Acute upper respiratory infection, unspecified: Secondary | ICD-10-CM | POA: Diagnosis not present

## 2023-10-14 MED ORDER — DOXYCYCLINE HYCLATE 100 MG PO TABS: 100.0000 mg | ORAL_TABLET | Freq: Two times a day (BID) | ORAL | 0 refills | Status: AC

## 2023-10-14 MED ORDER — BENZONATATE 100 MG PO CAPS: 100.0000 mg | ORAL_CAPSULE | Freq: Two times a day (BID) | ORAL | 0 refills | Status: DC | PRN

## 2023-10-14 MED ORDER — DOXYCYCLINE HYCLATE 100 MG PO TABS: 100.0000 mg | ORAL_TABLET | Freq: Two times a day (BID) | ORAL | 0 refills | Status: DC

## 2023-10-14 NOTE — Progress Notes (Signed)
E-Visit for Cough  We are sorry that you are not feeling well.  Here is how we plan to help!  Based on your presentation I believe you most likely have A cough due to bacteria.  When patients have a fever and a productive cough with a change in color or increased sputum production, we are concerned about bacterial bronchitis.  If left untreated it can progress to pneumonia.  If your symptoms do not improve with your treatment plan it is important that you contact your provider.   I have prescribed Doxycycline 100 mg twice a day for 7 days     In addition you may use A prescription cough medication called Tessalon Perles 100mg . You may take 1-2 capsules every 8 hours as needed for your cough.   From your responses in the eVisit questionnaire you describe inflammation in the upper respiratory tract which is causing a significant cough.  This is commonly called Bronchitis and has four common causes:   Allergies Viral Infections Acid Reflux Bacterial Infection Allergies, viruses and acid reflux are treated by controlling symptoms or eliminating the cause. An example might be a cough caused by taking certain blood pressure medications. You stop the cough by changing the medication. Another example might be a cough caused by acid reflux. Controlling the reflux helps control the cough.  USE OF BRONCHODILATOR ("RESCUE") INHALERS: There is a risk from using your bronchodilator too frequently.  The risk is that over-reliance on a medication which only relaxes the muscles surrounding the breathing tubes can reduce the effectiveness of medications prescribed to reduce swelling and congestion of the tubes themselves.  Although you feel brief relief from the bronchodilator inhaler, your asthma may actually be worsening with the tubes becoming more swollen and filled with mucus.  This can delay other crucial treatments, such as oral steroid medications. If you need to use a bronchodilator inhaler daily, several  times per day, you should discuss this with your provider.  There are probably better treatments that could be used to keep your asthma under control.     HOME CARE Only take medications as instructed by your medical team. Complete the entire course of an antibiotic. Drink plenty of fluids and get plenty of rest. Avoid close contacts especially the very young and the elderly Cover your mouth if you cough or cough into your sleeve. Always remember to wash your hands A steam or ultrasonic humidifier can help congestion.   GET HELP RIGHT AWAY IF: You develop worsening fever. You become short of breath You cough up blood. Your symptoms persist after you have completed your treatment plan MAKE SURE YOU  Understand these instructions. Will watch your condition. Will get help right away if you are not doing well or get worse.    Thank you for choosing an e-visit.  Your e-visit answers were reviewed by a board certified advanced clinical practitioner to complete your personal care plan. Depending upon the condition, your plan could have included both over the counter or prescription medications.  Please review your pharmacy choice. Make sure the pharmacy is open so you can pick up prescription now. If there is a problem, you may contact your provider through [employer] and have the prescription routed to another pharmacy.  Your safety is important to [place]. If you have drug allergies check your prescription carefully.   For the next 24 hours you can use MyChart to ask questions about today's visit, request a non-urgent call back, or ask for  a work or school excuse. You will get an email in the next two days asking about your experience. I hope that your e-visit has been valuable and will speed your recovery.   have provided 5 minutes of non face to face time during this encounter for chart review and documentation.

## 2023-10-14 NOTE — Addendum Note (Signed)
Addended by: Georgana Curio on: 10/14/2023 12:25 PM   Modules accepted: Orders

## 2023-10-17 ENCOUNTER — Encounter (INDEPENDENT_AMBULATORY_CARE_PROVIDER_SITE_OTHER): Payer: Self-pay | Admitting: Family Medicine

## 2023-10-17 ENCOUNTER — Ambulatory Visit (INDEPENDENT_AMBULATORY_CARE_PROVIDER_SITE_OTHER): Payer: PRIVATE HEALTH INSURANCE | Admitting: Family Medicine

## 2023-10-17 ENCOUNTER — Telehealth (INDEPENDENT_AMBULATORY_CARE_PROVIDER_SITE_OTHER): Payer: Self-pay | Admitting: Family Medicine

## 2023-10-17 VITALS — BP 114/75 | HR 77 | Temp 97.9°F | Ht 65.0 in | Wt 244.0 lb

## 2023-10-17 DIAGNOSIS — Z6841 Body Mass Index (BMI) 40.0 and over, adult: Secondary | ICD-10-CM

## 2023-10-17 DIAGNOSIS — E785 Hyperlipidemia, unspecified: Secondary | ICD-10-CM

## 2023-10-17 DIAGNOSIS — R7303 Prediabetes: Secondary | ICD-10-CM

## 2023-10-17 DIAGNOSIS — E782 Mixed hyperlipidemia: Secondary | ICD-10-CM

## 2023-10-17 NOTE — Assessment & Plan Note (Signed)
 Starting weight: 250 on 09/19/23 Peak weight: 250 BMR: 2102 Previous obesity management: GLP-1 medications previously but has significant GI side effects Body Fat %: 47.4% Starting Meal Plan: Category 4 Meal Plan needs: minimal processed food options, eating out options  Current weight of 244- patient has been able to be more in control of carb intake even when she has been ill.   She is still incorporating a bit of chocolate syrup in her milk and a bit of sugar when she will make her sweet potato.

## 2023-10-17 NOTE — Assessment & Plan Note (Signed)
 She has had diarrhea in the last week or two which she thinks is due to all the medications she was on for influenza and pneumonia.  She did fine the first two weeks on metformin  prior to getting ill.

## 2023-10-17 NOTE — Assessment & Plan Note (Signed)
 Last FLP not at goal.  Patient wants to see how much difference food changes will make- repeat FLP in 2 months.

## 2023-10-17 NOTE — Progress Notes (Signed)
 SUBJECTIVE:  Chief Complaint: Obesity  Interim History: patient is interested in substituting sweet potato for bread.  She is making substitutions for the turkey sausage with other protein options from the protein substitution list.  She ate out and it was a bit harder than she anticipated to estimate nutrition she is getting in.  She is is surprised she was down any weight due to her recent influenza and pneumonia.  She ended up needing antibiotics for pneumonia.    Camren is here to discuss her progress with her obesity treatment plan. She is on the Category 4 Plan and states she is following her eating plan approximately 25 % of the time. She states she is not exercising.   OBJECTIVE: Visit Diagnoses: Problem List Items Addressed This Visit       Other   Prediabetes - Primary   She has had diarrhea in the last week or two which she thinks is due to all the medications she was on for influenza and pneumonia.  She did fine the first two weeks on metformin  prior to getting ill.       Hyperlipidemia   Last FLP not at goal.  Patient wants to see how much difference food changes will make- repeat FLP in 2 months.      Morbid obesity (HCC)   Starting weight: 250 on 09/19/23 Peak weight: 250 BMR: 2102 Previous obesity management: GLP-1 medications previously but has significant GI side effects Body Fat %: 47.4% Starting Meal Plan: Category 4 Meal Plan needs: minimal processed food options, eating out options  Current weight of 244- patient has been able to be more in control of carb intake even when she has been ill.   She is still incorporating a bit of chocolate syrup in her milk and a bit of sugar when she will make her sweet potato.        Other Visit Diagnoses       BMI 40.0-44.9, adult (HCC)           Vitals Temp: 97.9 F (36.6 C) BP: 114/75 Pulse Rate: 77 SpO2: 96 %   Anthropometric Measurements Height: 5' 5 (1.651 m) Weight: 244 lb (110.7 kg) BMI  (Calculated): 40.6 Weight at Last Visit: 250 lb Weight Lost Since Last Visit: 4 Weight Gained Since Last Visit: 0 Starting Weight: 250 lb Total Weight Loss (lbs): 6 lb (2.722 kg) Peak Weight: 250 lb   Body Composition  Body Fat %: 46.2 % Fat Mass (lbs): 112.8 lbs Muscle Mass (lbs): 124.6 lbs Total Body Water (lbs): 88.4 lbs Visceral Fat Rating : 13   Other Clinical Data Today's Visit #: 3 Starting Date: 09/19/23     ASSESSMENT AND PLAN:  Diet: Tessi is currently in the action stage of change. As such, her goal is to continue with weight loss efforts. She has agreed to Category 4 Plan.  Exercise: [place] has been instructed  patient to not exercise at this time- currently recovering from pneumonia and influenza.     Behavior Modification:  We discussed the following Behavioral Modification Strategies today: increasing lean protein intake, decreasing simple carbohydrates, decreasing eating out, meal planning and cooking strategies, and keeping healthy foods in the home.   No follow-ups on file.SABRA She was informed of the importance of frequent follow up visits to maximize her success with intensive lifestyle modifications for her multiple health conditions.  Attestation Statements:   Reviewed by clinician on day of visit: allergies, medications, problem list, medical history, surgical  history, family history, social history, and previous encounter notes.     Adelita Cho, MD

## 2023-10-17 NOTE — Telephone Encounter (Signed)
 Dr.u wanted to schedule pt 2-3 wk fu but none avail. pt needs refill on metformin 

## 2023-10-29 ENCOUNTER — Other Ambulatory Visit (INDEPENDENT_AMBULATORY_CARE_PROVIDER_SITE_OTHER): Payer: Self-pay | Admitting: Family Medicine

## 2023-10-29 ENCOUNTER — Encounter (INDEPENDENT_AMBULATORY_CARE_PROVIDER_SITE_OTHER): Payer: Self-pay | Admitting: Family Medicine

## 2023-10-29 DIAGNOSIS — R7303 Prediabetes: Secondary | ICD-10-CM

## 2023-10-29 MED ORDER — METFORMIN HCL ER 500 MG PO TB24: 500.0000 mg | ORAL_TABLET | Freq: Every day | ORAL | 0 refills | Status: DC

## 2023-11-13 ENCOUNTER — Encounter (INDEPENDENT_AMBULATORY_CARE_PROVIDER_SITE_OTHER): Payer: Self-pay | Admitting: Family Medicine

## 2023-11-13 ENCOUNTER — Ambulatory Visit (INDEPENDENT_AMBULATORY_CARE_PROVIDER_SITE_OTHER): Payer: PRIVATE HEALTH INSURANCE | Admitting: Family Medicine

## 2023-11-13 VITALS — BP 107/74 | HR 81 | Temp 98.1°F | Ht 65.0 in | Wt 246.0 lb

## 2023-11-13 DIAGNOSIS — I10 Essential (primary) hypertension: Secondary | ICD-10-CM

## 2023-11-13 DIAGNOSIS — R7303 Prediabetes: Secondary | ICD-10-CM

## 2023-11-13 DIAGNOSIS — Z6841 Body Mass Index (BMI) 40.0 and over, adult: Secondary | ICD-10-CM

## 2023-11-13 DIAGNOSIS — E669 Obesity, unspecified: Secondary | ICD-10-CM

## 2023-11-13 MED ORDER — LOSARTAN POTASSIUM 50 MG PO TABS: 50.0000 mg | ORAL_TABLET | Freq: Every day | ORAL | Status: DC

## 2023-11-13 NOTE — Assessment & Plan Note (Signed)
 Blood pressure very well controlled today.  She is still on 50mg  of losartan.  If BP stays well controlled next appointment will consider halving medication.

## 2023-11-13 NOTE — Assessment & Plan Note (Signed)
 Last A1c 6.3 in January.  On metformin daily and no side effects noted. Does not need a refill today.  Will repeat labs in April.

## 2023-11-13 NOTE — Progress Notes (Signed)
 SUBJECTIVE:  Chief Complaint: Obesity  Interim History: Patient is wondering about how to substitute a protein shake for breakfast.  She finds drinking to be much easier.  She wants fruit swaps.  She has almost completely stopped eating fruit.  She is wondering about why she is so tired most of the time.  She is still working on decreasing total simple carbohydrates.  She believes she is over total calories for the day.  She doesn't like [place] sausage and wants to work in her cold [employer] drink.   Coy is here to discuss her progress with her obesity treatment plan. She is on the Category 4 Plan and states she is following her eating plan approximately 80 % of the time. She states she is walking and working in the yard.   OBJECTIVE: Visit Diagnoses: Problem List Items Addressed This Visit       Cardiovascular and Mediastinum   Hypertension - Primary   Blood pressure very well controlled today.  She is still on 50mg  of losartan.  If BP stays well controlled next appointment will consider halving medication.      Relevant Medications   losartan (COZAAR) 50 MG tablet     Other   Prediabetes   Last A1c 6.3 in January.  On metformin daily and no side effects noted. Does not need a refill today.  Will repeat labs in April.       Vitals Temp: 98.1 F (36.7 C) BP: 107/74 Pulse Rate: 81 SpO2: 95 %   Anthropometric Measurements Height: 5\' 5"  (1.651 m) Weight: 246 lb (111.6 kg) BMI (Calculated): 40.94 Weight at Last Visit: 244 lb Weight Lost Since Last Visit: 0 Weight Gained Since Last Visit: 2 Starting Weight: 250 lb Total Weight Loss (lbs): 4 lb (1.814 kg) Peak Weight: 250 lb   Body Composition  Body Fat %: 46.5 % Fat Mass (lbs): 114.4 lbs Muscle Mass (lbs): 125.2 lbs Total Body Water (lbs): 90.2 lbs Visceral Fat Rating : 13   Other Clinical Data Today's Visit #: 4 Starting Date: 09/19/23 Comments: Cat 4     ASSESSMENT AND PLAN:  Diet: [place] is  currently in the action stage of change. As such, her goal is to continue with weight loss efforts and has agreed to keeping a food journal and adhering to recommended goals of 1650-1800 calories and 120 or more grams protein daily. Patient to start food log or journaling meal plan.  The initial goal will be to habitually log or journal for at least 4 days a week.  The expectation it that patient may not initially meet calorie or protein goals as the nturitional understanding of food intake is begun.  We discussed the 10:1 ratio when reading a food label.  Patient agrees to keep a food log either electronically or on paper and bring to the next appointment to be able to dissect and discuss it with provider.    Exercise:  All adults should avoid inactivity. Some activity is better than none, and adults who participate in any amount of physical activity, gain some health benefits.  Behavior Modification:  We discussed the following Behavioral Modification Strategies today: increasing lean protein intake, decreasing simple carbohydrates, meal planning and cooking strategies, and better snacking choices.   No follow-ups on file.Marland Kitchen She was informed of the importance of frequent follow up visits to maximize her success with intensive lifestyle modifications for her multiple health conditions.  Attestation Statements:   Reviewed by clinician on day of visit:  allergies, medications, problem list, medical history, surgical history, family history, social history, and previous encounter notes.     Reuben Likes, MD

## 2023-11-27 ENCOUNTER — Other Ambulatory Visit (INDEPENDENT_AMBULATORY_CARE_PROVIDER_SITE_OTHER): Payer: Self-pay | Admitting: Family Medicine

## 2023-11-27 DIAGNOSIS — R7303 Prediabetes: Secondary | ICD-10-CM

## 2023-12-06 ENCOUNTER — Telehealth (INDEPENDENT_AMBULATORY_CARE_PROVIDER_SITE_OTHER): Payer: Self-pay | Admitting: Family Medicine

## 2023-12-06 ENCOUNTER — Encounter (INDEPENDENT_AMBULATORY_CARE_PROVIDER_SITE_OTHER): Payer: Self-pay | Admitting: Family Medicine

## 2023-12-06 ENCOUNTER — Ambulatory Visit (INDEPENDENT_AMBULATORY_CARE_PROVIDER_SITE_OTHER): Payer: PRIVATE HEALTH INSURANCE | Admitting: Family Medicine

## 2023-12-06 VITALS — BP 103/71 | HR 74 | Temp 98.0°F | Ht 65.0 in | Wt 244.0 lb

## 2023-12-06 DIAGNOSIS — Z6841 Body Mass Index (BMI) 40.0 and over, adult: Secondary | ICD-10-CM

## 2023-12-06 DIAGNOSIS — I10 Essential (primary) hypertension: Secondary | ICD-10-CM

## 2023-12-06 DIAGNOSIS — R7303 Prediabetes: Secondary | ICD-10-CM | POA: Diagnosis not present

## 2023-12-06 MED ORDER — LOSARTAN POTASSIUM 50 MG PO TABS: 25.0000 mg | ORAL_TABLET | Freq: Every day | ORAL | Status: DC

## 2023-12-06 MED ORDER — METFORMIN HCL ER 500 MG PO TB24: 500.0000 mg | ORAL_TABLET | Freq: Every day | ORAL | 0 refills | Status: DC

## 2023-12-06 MED ORDER — LOSARTAN POTASSIUM 50 MG PO TABS: 25.0000 mg | ORAL_TABLET | Freq: Every day | ORAL | 0 refills | Status: DC

## 2023-12-06 NOTE — Assessment & Plan Note (Signed)
 Started metformin with no GI side effects and is also employing conscious food choices to limit simple carbohydrates.  Refill of metformin sent in today.

## 2023-12-06 NOTE — Progress Notes (Addendum)
 SUBJECTIVE:  Chief Complaint: Obesity  Interim History: Patient is feeling frustrated with the speed of her weight loss.  She feels like the weight should be moving more than it is.  She voices that M-F she is on point and then Saturday and Sunday are variable in intake and she mentions she is likely calorie insufficient.  She is under quite a bit of stress recently with familial issues.  She started dancing again.  Heidi Schmitt is here to discuss her progress with her obesity treatment plan. She is on the keeping a food journal and adhering to recommended goals of 1650-1750 calories and 120 grams of protein and states she is following her eating plan approximately 75-80 % of the time. She states she is dancing hours 1 time per week.   OBJECTIVE: Visit Diagnoses: Problem List Items Addressed This Visit       Cardiovascular and Mediastinum   Hypertension - Primary   BP low normal today.  No dizziness or lightheadedness reported. Will decrease losartan to 25mg  daily.  Follow up on BP at next appointment with decrease in medication      Relevant Medications   losartan (COZAAR) 50 MG tablet     Other   Prediabetes   Started metformin with no GI side effects and is also employing conscious food choices to limit simple carbohydrates.  Refill of metformin sent in today.      Relevant Medications   metFORMIN (GLUCOPHAGE-XR) 500 MG 24 hr tablet   Morbid obesity (HCC)   Relevant Medications   metFORMIN (GLUCOPHAGE-XR) 500 MG 24 hr tablet   Other Visit Diagnoses       BMI 40.0-44.9, adult (HCC)       Relevant Medications   metFORMIN (GLUCOPHAGE-XR) 500 MG 24 hr tablet       Vitals Temp: 98 F (36.7 C) BP: 103/71 Pulse Rate: 74 SpO2: 98 %   Anthropometric Measurements Height: 5\' 5"  (1.651 m) Weight: 244 lb (110.7 kg) BMI (Calculated): 40.6 Weight at Last Visit: 244 lb Weight Lost Since Last Visit: 2 Weight Gained Since Last Visit: 0 Starting Weight: 250 lb Total Weight Loss  (lbs): 6 lb (2.722 kg) Peak Weight: 250 lb   Body Composition  Body Fat %: 46.3 % Fat Mass (lbs): 113 lbs Muscle Mass (lbs): 124.4 lbs Total Body Water (lbs): 90.8 lbs Visceral Fat Rating : 13   Other Clinical Data Today's Visit #: 5 Starting Date: 09/19/23 Comments: Cat 4     ASSESSMENT AND PLAN:  Diet: Heidi Schmitt is currently in the action stage of change. As such, her goal is to continue with weight loss efforts and has agreed to keeping a food journal and adhering to recommended goals of 1650-1800 calories and 125 or more grams of protein daily.   Exercise:  For substantial health benefits, adults should do at least 150 minutes (2 hours and 30 minutes) a week of moderate-intensity, or 75 minutes (1 hour and 15 minutes) a week of vigorous-intensity aerobic physical activity, or an equivalent combination of moderate- and vigorous-intensity aerobic activity. Aerobic activity should be performed in episodes of at least 10 minutes, and preferably, it should be spread throughout the week.  Behavior Modification:  We discussed the following Behavioral Modification Strategies today: increasing lean protein intake, increasing vegetables, meal planning and cooking strategies, avoiding temptations, planning for success, and keep a strict food journal.   Return in about 4 weeks (around 01/03/2024).Marland Kitchen She was informed of the importance of frequent follow up visits  to maximize her success with intensive lifestyle modifications for her multiple health conditions.  Attestation Statements:   Reviewed by clinician on day of visit: allergies, medications, problem list, medical history, surgical history, family history, social history, and previous encounter notes.     Reuben Likes, MD

## 2023-12-06 NOTE — Assessment & Plan Note (Signed)
 BP low normal today.  No dizziness or lightheadedness reported. Will decrease losartan to 25mg  daily.  Follow up on BP at next appointment with decrease in medication

## 2023-12-06 NOTE — Telephone Encounter (Signed)
 03/27 [facility] asked questions about converting Losartan 50 to 25. Phone number [phone]

## 2023-12-10 NOTE — Telephone Encounter (Signed)
 Spoke w/ Adela Lank at [facility]

## 2023-12-27 ENCOUNTER — Encounter (INDEPENDENT_AMBULATORY_CARE_PROVIDER_SITE_OTHER): Payer: Self-pay | Admitting: Family Medicine

## 2024-01-03 ENCOUNTER — Ambulatory Visit (INDEPENDENT_AMBULATORY_CARE_PROVIDER_SITE_OTHER): Payer: PRIVATE HEALTH INSURANCE | Admitting: Family Medicine

## 2024-01-03 ENCOUNTER — Encounter (INDEPENDENT_AMBULATORY_CARE_PROVIDER_SITE_OTHER): Payer: Self-pay | Admitting: Family Medicine

## 2024-01-03 VITALS — BP 126/79 | HR 74 | Temp 98.1°F | Ht 65.0 in | Wt 243.0 lb

## 2024-01-03 DIAGNOSIS — E559 Vitamin D deficiency, unspecified: Secondary | ICD-10-CM | POA: Diagnosis not present

## 2024-01-03 DIAGNOSIS — R7303 Prediabetes: Secondary | ICD-10-CM

## 2024-01-03 DIAGNOSIS — E785 Hyperlipidemia, unspecified: Secondary | ICD-10-CM

## 2024-01-03 DIAGNOSIS — Z6841 Body Mass Index (BMI) 40.0 and over, adult: Secondary | ICD-10-CM

## 2024-01-03 DIAGNOSIS — E782 Mixed hyperlipidemia: Secondary | ICD-10-CM

## 2024-01-03 DIAGNOSIS — R7401 Elevation of levels of liver transaminase levels: Secondary | ICD-10-CM

## 2024-01-03 NOTE — Progress Notes (Unsigned)
 SUBJECTIVE:  Chief Complaint: Obesity  Interim History: Patient not always hitting calories daily but is getting her protein in daily.  She saw her PCP recently.  She is still having her sweet tea daily that is watered down daily.  She is able to achieve her protein goal by lunch most days.    Heidi Schmitt is here to discuss her progress with her obesity treatment plan. She is on the keeping a food journal and adhering to recommended goals of 1650-1800 calories and 125 grams of protein and states she is following her eating plan approximately 75-80 % of the time. She states she is dancing 3 hours 1 time per week.   OBJECTIVE: Visit Diagnoses: Problem List Items Addressed This Visit       Other   Prediabetes - Primary   Relevant Orders   Hemoglobin A1c   Insulin , random   Hyperlipidemia   Relevant Orders   Lipid Panel With LDL/HDL Ratio   Vitamin D  deficiency   Relevant Orders   VITAMIN D  25 Hydroxy (Vit-D Deficiency, Fractures)   Transaminitis   Relevant Orders   Comprehensive metabolic panel with GFR   Morbid obesity (HCC)   Other Visit Diagnoses       BMI 40.0-44.9, adult (HCC)           Vitals Temp: 98.1 F (36.7 C) BP: 126/79 Pulse Rate: 74 SpO2: 98 %   Anthropometric Measurements Height: 5\' 5"  (1.651 m) Weight: 243 lb (110.2 kg) BMI (Calculated): 40.44 Weight at Last Visit: 244 lb Weight Lost Since Last Visit: 1 Weight Gained Since Last Visit: 0 Starting Weight: 250 lb Total Weight Loss (lbs): 7 lb (3.175 kg) Peak Weight: 250 lb   Body Composition  Body Fat %: 45.5 % Fat Mass (lbs): 110.6 lbs Muscle Mass (lbs): 125.8 lbs Total Body Water (lbs): 91.4 lbs Visceral Fat Rating : 13   Other Clinical Data Fasting: yes Labs: yes Today's Visit #: 6 Starting Date: 09/19/23 Comments: Cat 4     ASSESSMENT AND PLAN:  Diet: Heidi Schmitt is currently in the action stage of change. As such, her goal is to continue with weight loss efforts and has agreed to  keeping a food journal and adhering to recommended goals of 1650-1800 calories and 125 or more grams protein daily. Patient to start food log or journaling meal plan.  The initial goal will be to habitually log or journal for at least 4 days a week.  The expectation it that patient may not initially meet calorie or protein goals as the nturitional understanding of food intake is begun.  We discussed the 10:1 ratio when reading a food label.  Patient agrees to keep a food log either electronically or on paper and bring to the next appointment to be able to dissect and discuss it with provider.    Exercise:  For substantial health benefits, adults should do at least 150 minutes (2 hours and 30 minutes) a week of moderate-intensity, or 75 minutes (1 hour and 15 minutes) a week of vigorous-intensity aerobic physical activity, or an equivalent combination of moderate- and vigorous-intensity aerobic activity. Aerobic activity should be performed in episodes of at least 10 minutes, and preferably, it should be spread throughout the week.  Behavior Modification:  We discussed the following Behavioral Modification Strategies today: increasing lean protein intake, decreasing simple carbohydrates, meal planning and cooking strategies, keeping healthy foods in the home, planning for success, and keep a strict food journal.   No follow-ups on  file.. She was informed of the importance of frequent follow up visits to maximize her success with intensive lifestyle modifications for her multiple health conditions.  Attestation Statements:   Reviewed by clinician on day of visit: allergies, medications, problem list, medical history, surgical history, family history, social history, and previous encounter notes.     Donaciano Frizzle, MD

## 2024-01-04 LAB — COMPREHENSIVE METABOLIC PANEL WITH GFR
ALT: 27 IU/L (ref 0–32)
AST: 18 IU/L (ref 0–40)
Albumin: 4.5 g/dL (ref 3.9–4.9)
Alkaline Phosphatase: 113 IU/L (ref 44–121)
BUN/Creatinine Ratio: 19 (ref 9–23)
BUN: 15 mg/dL (ref 6–24)
Bilirubin Total: 0.4 mg/dL (ref 0.0–1.2)
CO2: 20 mmol/L (ref 20–29)
Calcium: 9.8 mg/dL (ref 8.7–10.2)
Chloride: 104 mmol/L (ref 96–106)
Creatinine, Ser: 0.81 mg/dL (ref 0.57–1.00)
Globulin, Total: 2.4 g/dL (ref 1.5–4.5)
Glucose: 107 mg/dL — ABNORMAL HIGH (ref 70–99)
Potassium: 4.3 mmol/L (ref 3.5–5.2)
Sodium: 137 mmol/L (ref 134–144)
Total Protein: 6.9 g/dL (ref 6.0–8.5)
eGFR: 92 mL/min/{1.73_m2} (ref >59)

## 2024-01-04 LAB — LIPID PANEL WITH LDL/HDL RATIO
Cholesterol, Total: 198 mg/dL (ref 100–199)
HDL: 37 mg/dL — ABNORMAL LOW (ref >39)
LDL Chol Calc (NIH): 140 mg/dL — ABNORMAL HIGH (ref 0–99)
LDL/HDL Ratio: 3.8 ratio — ABNORMAL HIGH (ref 0.0–3.2)
Triglycerides: 117 mg/dL (ref 0–149)
VLDL Cholesterol Cal: 21 mg/dL (ref 5–40)

## 2024-01-04 LAB — HEMOGLOBIN A1C
Est. average glucose Bld gHb Est-mCnc: 114 mg/dL
Hgb A1c MFr Bld: 5.6 % (ref 4.8–5.6)

## 2024-01-04 LAB — INSULIN, RANDOM: INSULIN: 24.8 u[IU]/mL (ref 2.6–24.9)

## 2024-01-04 LAB — VITAMIN D 25 HYDROXY (VIT D DEFICIENCY, FRACTURES): Vit D, 25-Hydroxy: 65.6 ng/mL (ref 30.0–100.0)

## 2024-01-04 NOTE — Assessment & Plan Note (Signed)
 Patient not on prescription strength Vit D.  Repeat Vitamin D  level done today.

## 2024-01-04 NOTE — Assessment & Plan Note (Signed)
 Patient fasting for labs today.  Repeat lipid panel today.  Not on medication for cholesterol management.

## 2024-01-04 NOTE — Assessment & Plan Note (Signed)
 Discussed medication options for treatment of prediabetes.  Will repeat labs and order A1c and Insulin  level today.  Patient agreeable to continue metformin .

## 2024-01-04 NOTE — Assessment & Plan Note (Signed)
 Last LFTs slightly elevated.  Repeat CMP today and will discuss lab results at next appointment.

## 2024-01-14 ENCOUNTER — Telehealth (INDEPENDENT_AMBULATORY_CARE_PROVIDER_SITE_OTHER): Payer: Self-pay | Admitting: Family Medicine

## 2024-01-14 ENCOUNTER — Encounter (INDEPENDENT_AMBULATORY_CARE_PROVIDER_SITE_OTHER): Payer: Self-pay | Admitting: Family Medicine

## 2024-01-14 ENCOUNTER — Ambulatory Visit (INDEPENDENT_AMBULATORY_CARE_PROVIDER_SITE_OTHER): Payer: PRIVATE HEALTH INSURANCE | Admitting: Family Medicine

## 2024-01-14 VITALS — BP 114/75 | HR 69 | Temp 97.9°F | Ht 65.0 in | Wt 244.0 lb

## 2024-01-14 DIAGNOSIS — Z6841 Body Mass Index (BMI) 40.0 and over, adult: Secondary | ICD-10-CM

## 2024-01-14 DIAGNOSIS — R632 Polyphagia: Secondary | ICD-10-CM | POA: Diagnosis not present

## 2024-01-14 MED ORDER — TOPIRAMATE 25 MG PO TABS: ORAL_TABLET | ORAL | 0 refills | Status: DC

## 2024-01-14 MED ORDER — LOMAIRA 8 MG PO TABS: ORAL_TABLET | ORAL | 0 refills | Status: DC

## 2024-01-14 NOTE — Telephone Encounter (Signed)
 Patient called in asking that whatever the new medications she was just prescribed be sent to the [facility] outpatient pharmacy in [place]. The pt also stated that a prior authorization needs to be completed for the medication. The pt was advised that she can call the pharmacy and get the prescription transferred. Please follow up with the patient.

## 2024-01-14 NOTE — Assessment & Plan Note (Signed)
 Medication options were discussed extensively with patient today.  Given availability, cost, insurance coverage and other medical comorbidities the decision was reached to start medications Lomaira and Topiramate.  These medications will be used as a substitute for brand name Qsymia in doses that are synanomous.  Patient understands this is an off label usage.  We discussed the titration schedule with the goal of 5% weight loss at 3 months at a treatment dose.  The first two weeks will be a starting dose of 25mg  of Topiramate and 4mg  of Lomaira.  After two weeks the patient will increase to 50mg  of Topiramate and 8mg  of Lomaira and will stay on this dose until the next appointment.  Controlled substance contract was discussed and signed today.  Prescriptions sent in.

## 2024-01-14 NOTE — Progress Notes (Signed)
 SUBJECTIVE:  Chief Complaint: Obesity  Interim History: Patient got a continuous glucose monitor and had 2 weeks of good data.  Her blood sugars are averaging 100-130s.  She had one elevated blood sugar to the 200s after having pizza with no protein.    Heidi Schmitt is here to discuss her progress with her obesity treatment plan. She is on the keeping a food journal and adhering to recommended goals of 1650-1800 calories and 125 grams of protein and states she is following her eating plan approximately 85-100 % of the time. She states she is walking or dancing for 60-90 minutes 3 times per week.   OBJECTIVE: Visit Diagnoses: Problem List Items Addressed This Visit       Other   Morbid obesity (HCC)   Relevant Medications   Phentermine HCl (LOMAIRA) 8 MG TABS   Polyphagia - Primary   Medication options were discussed extensively with patient today.  Given availability, cost, insurance coverage and other medical comorbidities the decision was reached to start medications Lomaira and Topiramate.  These medications will be used as a substitute for brand name Qsymia in doses that are synanomous.  Patient understands this is an off label usage.  We discussed the titration schedule with the goal of 5% weight loss at 3 months at a treatment dose.  The first two weeks will be a starting dose of 25mg  of Topiramate and 4mg  of Lomaira.  After two weeks the patient will increase to 50mg  of Topiramate and 8mg  of Lomaira and will stay on this dose until the next appointment.  Controlled substance contract was discussed and signed today.  Prescriptions sent in.        Relevant Medications   Phentermine HCl (LOMAIRA) 8 MG TABS   topiramate (TOPAMAX) 25 MG tablet   Other Visit Diagnoses       BMI 40.0-44.9, adult (HCC)       Relevant Medications   Phentermine HCl (LOMAIRA) 8 MG TABS       Vitals Temp: 97.9 F (36.6 C) BP: 114/75 Pulse Rate: 69 SpO2: 98 %   Anthropometric Measurements Height:  5\' 5"  (1.651 m) Weight: 244 lb (110.7 kg) BMI (Calculated): 40.6 Weight at Last Visit: 243 lb Weight Lost Since Last Visit: 0 Weight Gained Since Last Visit: 1 Starting Weight: 250 lbs Total Weight Loss (lbs): 6 lb (2.722 kg) Peak Weight: 250 lb   Body Composition  Body Fat %: 45.6 % Fat Mass (lbs): 112 lbs Muscle Mass (lbs): 125.4 lbs Total Body Water (lbs): 91 lbs Visceral Fat Rating : 13   Other Clinical Data Today's Visit #: 7 Starting Date: 09/19/23 Comments: 1650-1800/125     ASSESSMENT AND PLAN:  Diet: Heidi Schmitt is currently in the action stage of change. As such, her goal is to continue with weight loss efforts and has agreed to keeping a food journal and adhering to recommended goals of 1650-1800 calories and 120 or more grams protein daily.   Exercise:  For substantial health benefits, adults should do at least 150 minutes (2 hours and 30 minutes) a week of moderate-intensity, or 75 minutes (1 hour and 15 minutes) a week of vigorous-intensity aerobic physical activity, or an equivalent combination of moderate- and vigorous-intensity aerobic activity. Aerobic activity should be performed in episodes of at least 10 minutes, and preferably, it should be spread throughout the week.  Behavior Modification:  We discussed the following Behavioral Modification Strategies today: increasing lean protein intake, decreasing simple carbohydrates, meal planning and cooking  strategies, keeping healthy foods in the home, avoiding temptations, planning for success, and keep a strict food journal. We discussed various medication options to help [facility] with her weight loss efforts and we both agreed to start combination phentermine and topiramate at taper increase.  Return in about 3 weeks (around 02/04/2024).   She was informed of the importance of frequent follow up visits to maximize her success with intensive lifestyle modifications for her multiple health conditions.  Attestation  Statements:   Reviewed by clinician on day of visit: allergies, medications, problem list, medical history, surgical history, family history, social history, and previous encounter notes.     Donaciano Frizzle, MD

## 2024-01-15 NOTE — Telephone Encounter (Signed)
 PA done and approval sent to pt

## 2024-01-17 ENCOUNTER — Encounter (INDEPENDENT_AMBULATORY_CARE_PROVIDER_SITE_OTHER): Payer: Self-pay | Admitting: Family Medicine

## 2024-01-29 ENCOUNTER — Encounter (INDEPENDENT_AMBULATORY_CARE_PROVIDER_SITE_OTHER): Payer: Self-pay | Admitting: Family Medicine

## 2024-01-29 NOTE — Telephone Encounter (Signed)
**Note De-identified  [place] Obfuscation** Please advise 

## 2024-01-31 ENCOUNTER — Other Ambulatory Visit: Payer: Self-pay | Admitting: Obstetrics and Gynecology

## 2024-01-31 DIAGNOSIS — Z09 Encounter for follow-up examination after completed treatment for conditions other than malignant neoplasm: Secondary | ICD-10-CM

## 2024-01-31 DIAGNOSIS — N6320 Unspecified lump in the left breast, unspecified quadrant: Secondary | ICD-10-CM

## 2024-01-31 NOTE — Telephone Encounter (Signed)
 The pt called in asking that someone please respond the the message she sent on 5/20. Please follow up with patient.

## 2024-01-31 NOTE — Telephone Encounter (Signed)
**Note De-identified  [place] Obfuscation** Please advise 

## 2024-02-06 ENCOUNTER — Encounter (INDEPENDENT_AMBULATORY_CARE_PROVIDER_SITE_OTHER): Payer: Self-pay | Admitting: Family Medicine

## 2024-02-06 ENCOUNTER — Ambulatory Visit (INDEPENDENT_AMBULATORY_CARE_PROVIDER_SITE_OTHER): Payer: PRIVATE HEALTH INSURANCE | Admitting: Family Medicine

## 2024-02-06 VITALS — BP 118/80 | HR 71 | Temp 97.6°F | Ht 65.0 in | Wt 243.0 lb

## 2024-02-06 DIAGNOSIS — Z6841 Body Mass Index (BMI) 40.0 and over, adult: Secondary | ICD-10-CM

## 2024-02-06 DIAGNOSIS — R7303 Prediabetes: Secondary | ICD-10-CM

## 2024-02-06 DIAGNOSIS — R632 Polyphagia: Secondary | ICD-10-CM

## 2024-02-06 NOTE — Progress Notes (Signed)
 SUBJECTIVE:  Chief Complaint: Obesity  Interim History: Patient has been exhausted from starting her new medication combination.  She has been having to nap daily.  She has had a significantly depressed appetite.  She also very thirsty.  Yesterday she didn't eat enough and was very impacted from how little she ate.   Heidi Schmitt is here to discuss her progress with her obesity treatment plan. She is on the keeping a food journal and adhering to recommended goals of 1650-1800 calories and 120 grams of protein and states she is following her eating plan approximately 50 % of the time. She states she is dancing 1 time per week, but activity is down due to low energy.    OBJECTIVE: Visit Diagnoses: Problem List Items Addressed This Visit       Other   Prediabetes   Patient on metformin  but voices she is not eating all food on plan due to addition of phentermine  and topiramate .  Will continue metformin  at this time as patient is tolerating with no GI side effects and she had a recent decrease in A1c from 6.3 to 5.6.      Morbid obesity (HCC)   Anthropometric Measurements Height: 5\' 5"  (1.651 m) Weight: 243 lb (110.2 kg) BMI (Calculated): 40.44 Weight at Last Visit: 243 lb Weight Lost Since Last Visit: 1 Weight Gained Since Last Visit: 0 Starting Weight: 250 lbs Total Weight Loss (lbs): 7 lb (3.175 kg) Peak Weight: 250 lb Body Composition  Body Fat %: 45.8 % Fat Mass (lbs): 111.6 lbs Muscle Mass (lbs): 125.4 lbs Total Body Water (lbs): 90.2 lbs Visceral Fat Rating : 13 Other Clinical Data Fasting: yes Today's Visit #: 8 Starting Date: 09/19/23 Comments: 1650-1800/120       Relevant Medications   Phentermine  HCl (LOMAIRA ) 8 MG TABS   Polyphagia - Primary   Patient has experienced significant fatigue since starting combination of phentermine  and topiramate .  She is wondering if it is the combination of the medications or if it is 1 medication out of the 2.  We discussed taking 1  medication for 4 days then stopping medication for 4 days then taking the other medication for 4 days to get a better picture of what medication is causing the fatigue.  She will MyChart me and let me know after this experience.  PDMP was checked today with no concerns.  Refills of medication sent into pharmacy.      Relevant Medications   Phentermine  HCl (LOMAIRA ) 8 MG TABS   topiramate  (TOPAMAX ) 25 MG tablet   Other Visit Diagnoses       BMI 40.0-44.9, adult (HCC)       Relevant Medications   Phentermine  HCl (LOMAIRA ) 8 MG TABS       No data recorded       02/06/2024    8:00 AM 01/14/2024    3:00 PM 01/03/2024    9:00 AM  Vitals with BMI  Height 5\' 5"  5\' 5"  5\' 5"   Weight 243 lbs 244 lbs 243 lbs  BMI 40.44 40.6 40.44  Systolic 118 114 161  Diastolic 80 75 79  Pulse 71 69 74      ASSESSMENT AND PLAN:  Diet: Heidi Schmitt is currently in the action stage of change. As such, her goal is to continue with weight loss efforts and has agreed to keeping a food journal and adhering to recommended goals of 1650-1800 calories and 150 or more grams of protein daily.  Patient to start food  log or journaling meal plan.  The initial goal will be to habitually log or journal for at least 4 days a week.  The expectation it that patient may not initially meet calorie or protein goals as the nturitional understanding of food intake is begun.  We discussed the 10:1 ratio when reading a food label.  Patient agrees to keep a food log either electronically or on paper and bring to the next appointment to be able to dissect and discuss it with provider.    Exercise:  All adults should avoid inactivity. Some activity is better than none, and adults who participate in any amount of physical activity, gain some health benefits.  Behavior Modification:  We discussed the following Behavioral Modification Strategies today: increasing lean protein intake, decreasing simple carbohydrates, increasing vegetables,  meal planning and cooking strategies, keeping healthy foods in the home, and keep a strict food journal.   Return in about 4 weeks (around 03/05/2024).   She was informed of the importance of frequent follow up visits to maximize her success with intensive lifestyle modifications for her multiple health conditions.  Attestation Statements:   Reviewed by clinician on day of visit: allergies, medications, problem list, medical history, surgical history, family history, social history, and previous encounter notes.     Donaciano Frizzle, MD

## 2024-02-06 NOTE — Assessment & Plan Note (Signed)
 LDL improved on labs done at last appointment.  No medications currently.

## 2024-02-07 ENCOUNTER — Encounter (INDEPENDENT_AMBULATORY_CARE_PROVIDER_SITE_OTHER): Payer: Self-pay | Admitting: Family Medicine

## 2024-02-07 MED ORDER — LOMAIRA 8 MG PO TABS
8.0000 mg | ORAL_TABLET | Freq: Every day | ORAL | 0 refills | Status: AC
Start: 2024-02-07 — End: ?

## 2024-02-07 MED ORDER — TOPIRAMATE 25 MG PO TABS: 50.0000 mg | ORAL_TABLET | Freq: Every day | ORAL | 0 refills | Status: DC

## 2024-02-07 MED ORDER — LOMAIRA 8 MG PO TABS
8.0000 mg | ORAL_TABLET | Freq: Every day | ORAL | 0 refills | Status: DC
Start: 2024-02-07 — End: 2024-02-07

## 2024-02-14 NOTE — Assessment & Plan Note (Signed)
 Patient has experienced significant fatigue since starting combination of phentermine  and topiramate .  She is wondering if it is the combination of the medications or if it is 1 medication out of the 2.  We discussed taking 1 medication for 4 days then stopping medication for 4 days then taking the other medication for 4 days to get a better picture of what medication is causing the fatigue.  She will MyChart me and let me know after this experience.  PDMP was checked today with no concerns.  Refills of medication sent into pharmacy.

## 2024-02-14 NOTE — Assessment & Plan Note (Signed)
 Anthropometric Measurements Height: 5\' 5"  (1.651 m) Weight: 243 lb (110.2 kg) BMI (Calculated): 40.44 Weight at Last Visit: 243 lb Weight Lost Since Last Visit: 1 Weight Gained Since Last Visit: 0 Starting Weight: 250 lbs Total Weight Loss (lbs): 7 lb (3.175 kg) Peak Weight: 250 lb Body Composition  Body Fat %: 45.8 % Fat Mass (lbs): 111.6 lbs Muscle Mass (lbs): 125.4 lbs Total Body Water (lbs): 90.2 lbs Visceral Fat Rating : 13 Other Clinical Data Fasting: yes Today's Visit #: 8 Starting Date: 09/19/23 Comments: 1650-1800/120

## 2024-02-14 NOTE — Assessment & Plan Note (Signed)
 Patient on metformin  but voices she is not eating all food on plan due to addition of phentermine  and topiramate .  Will continue metformin  at this time as patient is tolerating with no GI side effects and she had a recent decrease in A1c from 6.3 to 5.6.

## 2024-02-27 ENCOUNTER — Encounter (INDEPENDENT_AMBULATORY_CARE_PROVIDER_SITE_OTHER): Payer: Self-pay | Admitting: Family Medicine

## 2024-02-27 ENCOUNTER — Ambulatory Visit (INDEPENDENT_AMBULATORY_CARE_PROVIDER_SITE_OTHER): Payer: PRIVATE HEALTH INSURANCE | Admitting: Family Medicine

## 2024-02-27 DIAGNOSIS — R632 Polyphagia: Secondary | ICD-10-CM

## 2024-02-27 DIAGNOSIS — I10 Essential (primary) hypertension: Secondary | ICD-10-CM

## 2024-02-27 DIAGNOSIS — Z6841 Body Mass Index (BMI) 40.0 and over, adult: Secondary | ICD-10-CM

## 2024-02-27 MED ORDER — LOSARTAN POTASSIUM 50 MG PO TABS: 25.0000 mg | ORAL_TABLET | Freq: Every day | ORAL | 0 refills | Status: DC

## 2024-02-27 MED ORDER — PHENTERMINE HCL 15 MG PO CAPS: 15.0000 mg | ORAL_CAPSULE | Freq: Every day | ORAL | 0 refills | Status: DC

## 2024-02-27 NOTE — Progress Notes (Unsigned)
   SUBJECTIVE:  Chief Complaint: Obesity  Interim History: patient is here for follow up.  She is experiencing more joint pain with increase of line dancing she is doing.  She is anticipating increased physical activity in July.  She is trying hard to not substitute her calories with increase in sweets to meet her total calorie intake.    Heidi Schmitt is here to discuss her progress with her obesity treatment plan. She is on the keeping a food journal and adhering to recommended goals of 1650-1800 calories and 150 grams of protein and states she is following her eating plan approximately 80 % of the time. She states she is dancing 3 hours 2 times per week.   OBJECTIVE: Visit Diagnoses: Problem List Items Addressed This Visit   None   Vitals Temp: 98 F (36.7 C) BP: 108/73 Pulse Rate: 75 SpO2: 99 %   Anthropometric Measurements Height: 5' 5 (1.651 m) Weight: 244 lb (110.7 kg) BMI (Calculated): 40.6 Weight at Last Visit: 243 lb Weight Lost Since Last Visit: 0 Weight Gained Since Last Visit: 1 Starting Weight: 250 lb Total Weight Loss (lbs): 6 lb (2.722 kg) Peak Weight: 250 lb   Body Composition  Body Fat %: 44.3 % Fat Mass (lbs): 108 lbs Muscle Mass (lbs): 129.2 lbs Total Body Water (lbs): 91.2 lbs Visceral Fat Rating : 12   Other Clinical Data Today's Visit #: 9 Starting Date: 09/19/23 Comments: 1650-1800/120     ASSESSMENT AND PLAN:  Diet: Reigna is currently in the action stage of change. As such, her goal is to continue with weight loss efforts and has agreed to keeping a food journal and adhering to recommended goals of 1650-1800 calories and 125 or more grams of protein daily. Patient to start food log or journaling meal plan.  The initial goal will be to habitually log or journal for at least 4 days a week.  The expectation it that patient may not initially meet calorie or protein goals as the nturitional understanding of food intake is begun.  We discussed the  10:1 ratio when reading a food label.  Patient agrees to keep a food log either electronically or on paper and bring to the next appointment to be able to dissect and discuss it with provider.    Exercise:  For substantial health benefits, adults should do at least 150 minutes (2 hours and 30 minutes) a week of moderate-intensity, or 75 minutes (1 hour and 15 minutes) a week of vigorous-intensity aerobic physical activity, or an equivalent combination of moderate- and vigorous-intensity aerobic activity. Aerobic activity should be performed in episodes of at least 10 minutes, and preferably, it should be spread throughout the week.  Behavior Modification:  We discussed the following Behavioral Modification Strategies today: increasing lean protein intake, decreasing simple carbohydrates, increasing vegetables, meal planning and cooking strategies, keeping healthy foods in the home, and keep a strict food journal. We discussed various medication options to help [facility] with her weight loss efforts and we both agreed to increase phentermine  to 15mg  daily.  No follow-ups on file.   She was informed of the importance of frequent follow up visits to maximize her success with intensive lifestyle modifications for her multiple health conditions.  Attestation Statements:   Reviewed by clinician on day of visit: allergies, medications, problem list, medical history, surgical history, family history, social history, and previous encounter notes.     Donaciano Frizzle, MD

## 2024-03-06 NOTE — Assessment & Plan Note (Signed)
 BP controlled today. No chest pain, chest pressure or headache. Doing well on losartan  25mg  daily.  Refill current med and dose and follow up on BP at next appointment.

## 2024-03-06 NOTE — Assessment & Plan Note (Signed)
 Tolerating phentermine  without any side effects (patient had significant fatigue with combo with topiramate ).  Needs a refill of phentermine  today.  No side effects mentioned.  PDMP checked and no concerns noted. Will increase phentermine  to 15mg  to see if this assists in weight loss.

## 2024-03-06 NOTE — Assessment & Plan Note (Signed)
 Anthropometric Measurements Height: 5' 5 (1.651 m) Weight: 244 lb (110.7 kg) BMI (Calculated): 40.6 Weight at Last Visit: 243 lb Weight Lost Since Last Visit: 0 Weight Gained Since Last Visit: 1 Starting Weight: 250 lb Total Weight Loss (lbs): 6 lb (2.722 kg) Peak Weight: 250 lb Body Composition  Body Fat %: 44.3 % Fat Mass (lbs): 108 lbs Muscle Mass (lbs): 129.2 lbs Total Body Water (lbs): 91.2 lbs Visceral Fat Rating : 12 Other Clinical Data Today's Visit #: 9 Starting Date: 09/19/23 Comments: 1650-1800/120

## 2024-03-19 ENCOUNTER — Ambulatory Visit
Admission: RE | Admit: 2024-03-19 | Discharge: 2024-03-19 | Disposition: A | Discharge status: Home / Self Care | Payer: PRIVATE HEALTH INSURANCE | Source: Ambulatory Visit | Attending: Obstetrics and Gynecology | Admitting: Obstetrics and Gynecology

## 2024-03-19 DIAGNOSIS — N6320 Unspecified lump in the left breast, unspecified quadrant: Secondary | ICD-10-CM

## 2024-03-19 DIAGNOSIS — Z09 Encounter for follow-up examination after completed treatment for conditions other than malignant neoplasm: Secondary | ICD-10-CM

## 2024-03-20 ENCOUNTER — Encounter (INDEPENDENT_AMBULATORY_CARE_PROVIDER_SITE_OTHER): Payer: Self-pay | Admitting: Family Medicine

## 2024-03-20 ENCOUNTER — Ambulatory Visit (INDEPENDENT_AMBULATORY_CARE_PROVIDER_SITE_OTHER): Payer: PRIVATE HEALTH INSURANCE | Admitting: Family Medicine

## 2024-03-20 VITALS — BP 128/79 | HR 71 | Temp 98.2°F | Ht 65.0 in | Wt 241.0 lb

## 2024-03-20 DIAGNOSIS — Z6841 Body Mass Index (BMI) 40.0 and over, adult: Secondary | ICD-10-CM

## 2024-03-20 DIAGNOSIS — I10 Essential (primary) hypertension: Secondary | ICD-10-CM

## 2024-03-20 DIAGNOSIS — R7303 Prediabetes: Secondary | ICD-10-CM | POA: Diagnosis not present

## 2024-03-20 MED ORDER — LOSARTAN POTASSIUM 50 MG PO TABS: 25.0000 mg | ORAL_TABLET | Freq: Every day | ORAL | 0 refills | Status: DC

## 2024-03-20 MED ORDER — METFORMIN HCL ER 500 MG PO TB24
500.0000 mg | ORAL_TABLET | Freq: Every day | ORAL | 0 refills | Status: DC
Start: 2024-03-20 — End: 2024-06-25

## 2024-03-20 MED ORDER — PHENTERMINE HCL 30 MG PO CAPS: 30.0000 mg | ORAL_CAPSULE | Freq: Every day | ORAL | 0 refills | Status: DC

## 2024-03-20 NOTE — Progress Notes (Signed)
 SUBJECTIVE:  Chief Complaint: Obesity  Interim History: patient got injections in her knee last week in preparation for her upcoming dance weekend.  She is getting some relief from her knee pain.  Heidi Schmitt is here to discuss her progress with her obesity treatment plan. She is on the keeping a food journal and adhering to recommended goals of 1650-1800 calories and 150 grams of protein and states she is following her eating plan approximately 80-85 % of the time. She states she is dancing  2-3 hours  1-2 times per week.   OBJECTIVE: Visit Diagnoses: Problem List Items Addressed This Visit       Cardiovascular and Mediastinum   Hypertension - Primary   Relevant Medications   losartan  (COZAAR ) 50 MG tablet     Other   Prediabetes   Relevant Medications   metFORMIN  (GLUCOPHAGE -XR) 500 MG 24 hr tablet   Morbid obesity (HCC)   Relevant Medications   phentermine  30 MG capsule   metFORMIN  (GLUCOPHAGE -XR) 500 MG 24 hr tablet   Other Visit Diagnoses       BMI 40.0-44.9, adult (HCC)       Relevant Medications   phentermine  30 MG capsule   metFORMIN  (GLUCOPHAGE -XR) 500 MG 24 hr tablet       Vitals Temp: 98.2 F (36.8 C) BP: 128/79 Pulse Rate: 71 SpO2: 100 %   Anthropometric Measurements Height: 5' 5 (1.651 m) Weight: 241 lb (109.3 kg) BMI (Calculated): 40.1 Weight at Last Visit: 244 lb Weight Lost Since Last Visit: 3 Weight Gained Since Last Visit: 0 Starting Weight: 250 lb Total Weight Loss (lbs): 9 lb (4.082 kg) Peak Weight: 250 lb   Body Composition  Body Fat %: 44.2 % Fat Mass (lbs): 106.8 lbs Muscle Mass (lbs): 128 lbs Total Body Water (lbs): 89.4 lbs Visceral Fat Rating : 12   Other Clinical Data Today's Visit #: 10 Starting Date: 09/19/23 Comments: 1650/1800/150     ASSESSMENT AND PLAN: Assessment & Plan Primary hypertension Blood pressure well controlled today.  No chest pain, chest pressure or headache.  Needs a refill of her losartan   today.  No change in dose. Prediabetes Patient is doing well on metformin  with no GI side effects.  Needs a refill of metformin  today.  Continue current dose. Morbid obesity (HCC)  BMI 40.0-44.9, adult (HCC)    Diet: Heidi Schmitt is currently in the action stage of change. As such, her goal is to continue with weight loss efforts and has agreed to keeping a food journal and adhering to recommended goals of 1650-1800 calories and 150 grams of protein.   Exercise:  For substantial health benefits, adults should do at least 150 minutes (2 hours and 30 minutes) a week of moderate-intensity, or 75 minutes (1 hour and 15 minutes) a week of vigorous-intensity aerobic physical activity, or an equivalent combination of moderate- and vigorous-intensity aerobic activity. Aerobic activity should be performed in episodes of at least 10 minutes, and preferably, it should be spread throughout the week.  Behavior Modification:  We discussed the following Behavioral Modification Strategies today: increasing lean protein intake, decreasing simple carbohydrates, increasing vegetables, meal planning and cooking strategies, and keep a strict food journal. We discussed various medication options to help Heidi Schmitt with her weight loss efforts and we both agreed to increase phentermine  to 30mg  daily.  Return in about 4 weeks (around 04/17/2024).   She was informed of the importance of frequent follow up visits to maximize her success with intensive lifestyle modifications for  her multiple health conditions.  Attestation Statements:   Reviewed by clinician on day of visit: allergies, medications, problem list, medical history, surgical history, family history, social history, and previous encounter notes.     Adelita Cho, MD

## 2024-03-26 NOTE — Assessment & Plan Note (Signed)
 Blood pressure well controlled today.  No chest pain, chest pressure or headache.  Needs a refill of her losartan  today.  No change in dose.

## 2024-03-26 NOTE — Assessment & Plan Note (Signed)
 Patient is doing well on metformin  with no GI side effects.  Needs a refill of metformin  today.  Continue current dose.

## 2024-04-16 ENCOUNTER — Encounter (INDEPENDENT_AMBULATORY_CARE_PROVIDER_SITE_OTHER): Payer: Self-pay | Admitting: Family Medicine

## 2024-04-16 ENCOUNTER — Ambulatory Visit (INDEPENDENT_AMBULATORY_CARE_PROVIDER_SITE_OTHER): Payer: PRIVATE HEALTH INSURANCE | Admitting: Family Medicine

## 2024-04-16 VITALS — BP 126/77 | HR 84 | Temp 98.0°F | Ht 65.0 in | Wt 240.0 lb

## 2024-04-16 DIAGNOSIS — E782 Mixed hyperlipidemia: Secondary | ICD-10-CM

## 2024-04-16 DIAGNOSIS — Z6839 Body mass index (BMI) 39.0-39.9, adult: Secondary | ICD-10-CM | POA: Diagnosis not present

## 2024-04-16 MED ORDER — PHENTERMINE HCL 30 MG PO CAPS
30.0000 mg | ORAL_CAPSULE | Freq: Every day | ORAL | 0 refills | Status: DC
Start: 2024-04-16 — End: 2024-05-06

## 2024-04-16 NOTE — Progress Notes (Signed)
 SUBJECTIVE:  Chief Complaint: Obesity  Interim History: Patient dislocated her knee at her dance trip that she went on.  She is walking a mile 3 days a week.  She needs to grow more quad strength.  She was able to brace her leg to dance the other day.  She is was trying to get into the 230s this appointment.  She is feeling the effects of phentermine .  She hasn't been able to consistently log due to demands at home and upcoming school starting for her kids.  She has been incorporating sweet potatoes a bit more frequently to up her calories. Home remodeling is underway.   Heidi Schmitt is here to discuss her progress with her obesity treatment plan. She is on the keeping a food journal and adhering to recommended goals of 1650-1800 calories and 150 grams of protein and states she is following her eating plan approximately 75-80 % of the time. She states she is walking and dancing 45-120 minutes 4 times per week.   OBJECTIVE: Visit Diagnoses: Problem List Items Addressed This Visit       Other   Hyperlipidemia - Primary   Morbid obesity (HCC)   Relevant Medications   phentermine  30 MG capsule   Other Visit Diagnoses       BMI 39.0-39.9,adult           Vitals Temp: 98 F (36.7 C) BP: 126/77 Pulse Rate: 84 SpO2: 99 %   Anthropometric Measurements Height: 5' 5 (1.651 m) Weight: 240 lb (108.9 kg) BMI (Calculated): 39.94 Weight at Last Visit: 241 lb Weight Lost Since Last Visit: 1 Weight Gained Since Last Visit: 0 Starting Weight: 250 lb Total Weight Loss (lbs): 10 lb (4.536 kg)   Body Composition  Body Fat %: 44.9 % Fat Mass (lbs): 107.8 lbs Muscle Mass (lbs): 125.8 lbs Total Body Water (lbs): 88.8 lbs Visceral Fat Rating : 12   Other Clinical Data Today's Visit #: 11 Starting Date: 09/19/23 Comments: 1650-1800/150     ASSESSMENT AND PLAN: Assessment & Plan Mixed hyperlipidemia Discussed healthy fat intake and limiting saturated fat intake to 20% or less of  daily intake total.  Patient understands the difference between saturated and unsaturated fats and more work on incorporation of healthy fats to increase total calorie intake. BMI 39.0-39.9,adult  Morbid obesity (HCC) PDMP checked today with no concerns.  Needs a refill of phentermine .  No side effects mentioned.   Diet: Heidi Schmitt is currently in the action stage of change. As such, her goal is to continue with weight loss efforts and has agreed to keeping a food journal and adhering to recommended goals of 1700-1800 calories and 150 or more grams of protein daily.   Exercise:  For substantial health benefits, adults should do at least 150 minutes (2 hours and 30 minutes) a week of moderate-intensity, or 75 minutes (1 hour and 15 minutes) a week of vigorous-intensity aerobic physical activity, or an equivalent combination of moderate- and vigorous-intensity aerobic activity. Aerobic activity should be performed in episodes of at least 10 minutes, and preferably, it should be spread throughout the week.  Behavior Modification:  We discussed the following Behavioral Modification Strategies today: increasing lean protein intake, decreasing simple carbohydrates, increasing vegetables, meal planning and cooking strategies, planning for success, and keep a strict food journal. We discussed various medication options to help Heidi Schmitt with her weight loss efforts and we both agreed to continue phentermine  at 30mg  daily.  Return in about 3 weeks (around 05/07/2024).  She was informed of the importance of frequent follow up visits to maximize her success with intensive lifestyle modifications for her multiple health conditions.  Attestation Statements:   Reviewed by clinician on day of visit: allergies, medications, problem list, medical history, surgical history, family history, social history, and previous encounter notes.     Heidi Cho, MD

## 2024-04-28 NOTE — Assessment & Plan Note (Signed)
 Discussed healthy fat intake and limiting saturated fat intake to 20% or less of daily intake total.  Patient understands the difference between saturated and unsaturated fats and more work on incorporation of healthy fats to increase total calorie intake.

## 2024-04-28 NOTE — Assessment & Plan Note (Signed)
 PDMP checked today with no concerns.  Needs a refill of phentermine .  No side effects mentioned.

## 2024-05-06 ENCOUNTER — Encounter (INDEPENDENT_AMBULATORY_CARE_PROVIDER_SITE_OTHER): Payer: Self-pay | Admitting: Family Medicine

## 2024-05-06 ENCOUNTER — Ambulatory Visit (INDEPENDENT_AMBULATORY_CARE_PROVIDER_SITE_OTHER): Payer: PRIVATE HEALTH INSURANCE | Admitting: Family Medicine

## 2024-05-06 DIAGNOSIS — I10 Essential (primary) hypertension: Secondary | ICD-10-CM

## 2024-05-06 DIAGNOSIS — E782 Mixed hyperlipidemia: Secondary | ICD-10-CM | POA: Diagnosis not present

## 2024-05-06 DIAGNOSIS — Z6841 Body Mass Index (BMI) 40.0 and over, adult: Secondary | ICD-10-CM

## 2024-05-06 MED ORDER — WEGOVY 0.25 MG/0.5ML ~~LOC~~ SOAJ: 0.2500 mg | SUBCUTANEOUS | 0 refills | Status: DC

## 2024-05-06 NOTE — Progress Notes (Unsigned)
 SUBJECTIVE:  Chief Complaint: Obesity  Interim History: Patient is working on staying consistent to logging food and aiming for total calorie intake.  She is feeling frustrated with lack of weight loss.  Kids are back in school now- her daughter is driving and picking up and dropping off her son.  Her home addition is still 6-8 weeks behind. She is planning to dance over the holiday weekend.  She is doing PT for her knee.  Heidi Schmitt is here to discuss her progress with her obesity treatment plan. She is on the keeping a food journal and adhering to recommended goals of 1700-1800 calories and 150 grams of protein and states she is following her eating plan approximately 90 % of the time. She states she is dancing 120 minutes 2 times per week.   OBJECTIVE: Visit Diagnoses: Problem List Items Addressed This Visit   None   Vitals Temp: 97.9 F (36.6 C) BP: 124/78 Pulse Rate: 84 SpO2: 98 %   Anthropometric Measurements Height: 5' 5 (1.651 m) Weight: 241 lb (109.3 kg) BMI (Calculated): 40.1 Weight at Last Visit: 240 lb Weight Lost Since Last Visit: 0 Weight Gained Since Last Visit: 1 Starting Weight: 250 lb Total Weight Loss (lbs): 9 lb (4.082 kg)   Body Composition  Body Fat %: 45.4 % Fat Mass (lbs): 109.6 lbs Muscle Mass (lbs): 125.2 lbs Total Body Water (lbs): 89.4 lbs Visceral Fat Rating : 13   Other Clinical Data Today's Visit #: 12 Starting Date: 09/19/23 Comments: 1700-1800/150     ASSESSMENT AND PLAN: Assessment & Plan BMI 40.0-44.9, adult (HCC)  Primary hypertension Blood pressure well-controlled today.  Patient's blood pressure controlled on losartan  50 mg daily.  No chest pain, chest pressure, or headache reported. Morbid obesity (HCC) No appreciable/significant weight gain loss on combination medication of phentermine  and topiramate  but significant side effects of topiramate .  Transition made to phentermine  alone with no significant weight loss on  almost max dose.  Patient is agreeable to trying lowest dose of Wegovy .  Discussed side effect profile as well as benefits and risk of medications today.  Patient previously on GLP-1 medication with significant GI side effects but started at the second lowest dose.  Will start at lowest dose of Wegovy  0.25 mg weekly and reassess at next appointment. Mixed hyperlipidemia Previously elevated LDL and labs done in April of this year.  Patient has been staying mindful of saturated fat intake while monitoring her daily calorie and protein intake goals.  Will continue with lifestyle and dietary modifications at this time with a plan to repeat labs in October of this year.   Diet: Georgena is currently in the action stage of change. As such, her goal is to continue with weight loss efforts and has agreed to keeping a food journal and adhering to recommended goals of 1700-1800 calories and 150 or more grams of protein.   Exercise:  For substantial health benefits, adults should do at least 150 minutes (2 hours and 30 minutes) a week of moderate-intensity, or 75 minutes (1 hour and 15 minutes) a week of vigorous-intensity aerobic physical activity, or an equivalent combination of moderate- and vigorous-intensity aerobic activity. Aerobic activity should be performed in episodes of at least 10 minutes, and preferably, it should be spread throughout the week.  Behavior Modification:  We discussed the following Behavioral Modification Strategies today: increasing lean protein intake, decreasing simple carbohydrates, increasing vegetables, meal planning and cooking strategies, keeping healthy foods in the home, planning for success,  and keep a strict food journal. We discussed various medication options to help Amaranta with her weight loss efforts and we both agreed to stop phentermine  and start wegovy  0.25mg  weekly.  No follow-ups on file.   She was informed of the importance of frequent follow up visits to maximize  her success with intensive lifestyle modifications for her multiple health conditions.  Attestation Statements:   Reviewed by clinician on day of visit: allergies, medications, problem list, medical history, surgical history, family history, social history, and previous encounter notes.     Adelita Cho, MD

## 2024-05-07 NOTE — Assessment & Plan Note (Signed)
 Blood pressure well-controlled today.  Patient's blood pressure controlled on losartan  50 mg daily.  No chest pain, chest pressure, or headache reported.

## 2024-05-07 NOTE — Assessment & Plan Note (Signed)
 No appreciable/significant weight gain loss on combination medication of phentermine  and topiramate  but significant side effects of topiramate .  Transition made to phentermine  alone with no significant weight loss on almost max dose.  Patient is agreeable to trying lowest dose of Wegovy .  Discussed side effect profile as well as benefits and risk of medications today.  Patient previously on GLP-1 medication with significant GI side effects but started at the second lowest dose.  Will start at lowest dose of Wegovy  0.25 mg weekly and reassess at next appointment.

## 2024-05-07 NOTE — Assessment & Plan Note (Signed)
 Previously elevated LDL and labs done in April of this year.  Patient has been staying mindful of saturated fat intake while monitoring her daily calorie and protein intake goals.  Will continue with lifestyle and dietary modifications at this time with a plan to repeat labs in October of this year.

## 2024-05-14 ENCOUNTER — Ambulatory Visit (INDEPENDENT_AMBULATORY_CARE_PROVIDER_SITE_OTHER): Payer: PRIVATE HEALTH INSURANCE | Admitting: Family Medicine

## 2024-05-29 ENCOUNTER — Telehealth (INDEPENDENT_AMBULATORY_CARE_PROVIDER_SITE_OTHER): Payer: Self-pay

## 2024-05-29 NOTE — Telephone Encounter (Signed)
 Prior Authorization for Wegovy   Available without authorization.

## 2024-06-03 ENCOUNTER — Ambulatory Visit (INDEPENDENT_AMBULATORY_CARE_PROVIDER_SITE_OTHER): Payer: PRIVATE HEALTH INSURANCE | Admitting: Family Medicine

## 2024-06-03 ENCOUNTER — Encounter (INDEPENDENT_AMBULATORY_CARE_PROVIDER_SITE_OTHER): Payer: Self-pay | Admitting: Family Medicine

## 2024-06-03 VITALS — BP 114/73 | HR 80 | Temp 97.7°F | Ht 65.0 in | Wt 239.0 lb

## 2024-06-03 DIAGNOSIS — I10 Essential (primary) hypertension: Secondary | ICD-10-CM

## 2024-06-03 DIAGNOSIS — R7303 Prediabetes: Secondary | ICD-10-CM | POA: Diagnosis not present

## 2024-06-03 DIAGNOSIS — Z6839 Body mass index (BMI) 39.0-39.9, adult: Secondary | ICD-10-CM | POA: Diagnosis not present

## 2024-06-03 MED ORDER — WEGOVY 0.25 MG/0.5ML ~~LOC~~ SOAJ
0.2500 mg | SUBCUTANEOUS | 0 refills | Status: DC
Start: 2024-06-03 — End: 2024-06-25

## 2024-06-03 NOTE — Progress Notes (Signed)
 SUBJECTIVE:  Chief Complaint: Obesity  Interim History: Patient feeling not the best on her medication currently.  She is feeling very bloated on her medication.  She has had her protein shake and mentions she often won't eat again until supper.  She is experiencing some reflux. She is working on her protein intake but is not getting her calories daily.  She thinks she is barely getting close to 1200 calories.  Her remodel is still happening. She is stressed with her home renovations happening.  Heidi Schmitt is here to discuss her progress with her obesity treatment plan. She is on the keeping a food journal and adhering to recommended goals of 1700-1800 calories and 150 grams of protein and states she is following her eating plan approximately 5-10 % of the time. She states she is dancing 2-3 hours per week.    OBJECTIVE: Visit Diagnoses: Problem List Items Addressed This Visit       Other   Morbid obesity (HCC) - Primary   Other Visit Diagnoses       BMI 39.0-39.9,adult           Vitals Temp: 97.7 F (36.5 C) BP: 114/73 Pulse Rate: 80 SpO2: 99 %   Anthropometric Measurements Height: 5' 5 (1.651 m) Weight: 239 lb (108.4 kg) BMI (Calculated): 39.77 Weight at Last Visit: 241 Weight Lost Since Last Visit: 2 Weight Gained Since Last Visit: 0 Starting Weight: 250 lb Total Weight Loss (lbs): 11 lb (4.99 kg)   Body Composition  Body Fat %: 44.8 % Fat Mass (lbs): 107.2 lbs Muscle Mass (lbs): 125.4 lbs Total Body Water (lbs): 88.6 lbs Visceral Fat Rating : 12   Other Clinical Data Today's Visit #: 13 Starting Date: 09/19/23 Comments: 1700-1800/150     ASSESSMENT AND PLAN: Assessment & Plan Prediabetes Patient reports she is trying to limit her carbohydrate intake even if she has extra calories during the end of the day.  We discussed the importance of caloric intake if she has achieved her protein goal that those are not extra carbohydrates which will not  ultimately increase her A1c.  Will need close follow-up on next labs to be done at next appointment which will be 6 months from prior appointment.  She was started semaglutide  2 months prior to labs at that point. Primary hypertension Blood pressure well-controlled today with no dizziness or lightheadedness even with significant amount of physical activity patient is doing.  Will follow-up at subsequent appointments and titrate medication as allowable by patient's blood pressure. Morbid obesity (HCC)  BMI 39.0-39.9,adult    Diet: Angellica is currently in the action stage of change. As such, her goal is to continue with weight loss efforts and has agreed to keeping a food journal and adhering to recommended goals of 1700-1800 calories and 100 or more grams protein. Patient to increase frequency of liquid IV daily.  Exercise:  For substantial health benefits, adults should do at least 150 minutes (2 hours and 30 minutes) a week of moderate-intensity, or 75 minutes (1 hour and 15 minutes) a week of vigorous-intensity aerobic physical activity, or an equivalent combination of moderate- and vigorous-intensity aerobic activity. Aerobic activity should be performed in episodes of at least 10 minutes, and preferably, it should be spread throughout the week.  Behavior Modification:  We discussed the following Behavioral Modification Strategies today: increasing lean protein intake, decreasing simple carbohydrates, increasing vegetables, meal planning and cooking strategies, planning for success, and keep a strict food journal. We discussed various medication  options to help [facility] with her weight loss efforts and we both agreed to continue wegovy  at current dose given patient's significant GI side effects.  Return in about 4 weeks (around 07/01/2024).   She was informed of the importance of frequent follow up visits to maximize her success with intensive lifestyle modifications for her multiple health  conditions.  Attestation Statements:   Reviewed by clinician on day of visit: allergies, medications, problem list, medical history, surgical history, family history, social history, and previous encounter notes.     Adelita Cho, MD

## 2024-06-08 NOTE — Assessment & Plan Note (Signed)
 Patient reports she is trying to limit her carbohydrate intake even if she has extra calories during the end of the day.  We discussed the importance of caloric intake if she has achieved her protein goal that those are not extra carbohydrates which will not ultimately increase her A1c.  Will need close follow-up on next labs to be done at next appointment which will be 6 months from prior appointment.  She was started semaglutide  2 months prior to labs at that point.

## 2024-06-08 NOTE — Assessment & Plan Note (Signed)
 Blood pressure well-controlled today with no dizziness or lightheadedness even with significant amount of physical activity patient is doing.  Will follow-up at subsequent appointments and titrate medication as allowable by patient's blood pressure.

## 2024-06-24 ENCOUNTER — Ambulatory Visit (INDEPENDENT_AMBULATORY_CARE_PROVIDER_SITE_OTHER): Payer: PRIVATE HEALTH INSURANCE | Admitting: Family Medicine

## 2024-06-25 ENCOUNTER — Encounter (INDEPENDENT_AMBULATORY_CARE_PROVIDER_SITE_OTHER): Payer: Self-pay | Admitting: Family Medicine

## 2024-06-25 ENCOUNTER — Ambulatory Visit (INDEPENDENT_AMBULATORY_CARE_PROVIDER_SITE_OTHER): Payer: PRIVATE HEALTH INSURANCE | Admitting: Family Medicine

## 2024-06-25 DIAGNOSIS — R7303 Prediabetes: Secondary | ICD-10-CM

## 2024-06-25 DIAGNOSIS — I10 Essential (primary) hypertension: Secondary | ICD-10-CM | POA: Diagnosis not present

## 2024-06-25 DIAGNOSIS — F439 Reaction to severe stress, unspecified: Secondary | ICD-10-CM | POA: Diagnosis not present

## 2024-06-25 DIAGNOSIS — Z6839 Body mass index (BMI) 39.0-39.9, adult: Secondary | ICD-10-CM

## 2024-06-25 MED ORDER — METFORMIN HCL ER 500 MG PO TB24: 500.0000 mg | ORAL_TABLET | Freq: Every day | ORAL | 0 refills | Status: DC

## 2024-06-25 MED ORDER — WEGOVY 0.5 MG/0.5ML ~~LOC~~ SOAJ: 0.5000 mg | SUBCUTANEOUS | 0 refills | Status: DC

## 2024-06-25 MED ORDER — LOSARTAN POTASSIUM 50 MG PO TABS: 25.0000 mg | ORAL_TABLET | Freq: Every day | ORAL | 0 refills | Status: DC

## 2024-06-25 MED ORDER — BUSPIRONE HCL 5 MG PO TABS: 5.0000 mg | ORAL_TABLET | Freq: Three times a day (TID) | ORAL | 0 refills | Status: DC | PRN

## 2024-06-25 NOTE — Progress Notes (Unsigned)
   SUBJECTIVE:  Chief Complaint: Obesity  Interim History: Patient found out that her contractor swindled her out of 100k.  She is very stressed.  She hasn't slept much.  She is spreading out her wegovy  to every 10 days and she isn't get close to her goal calorie intake.  She is lucky to get to 1500 calories.  She recognizes she is very stressed and knows this is playing a part in her weight loss.   Heidi Schmitt is here to discuss her progress with her obesity treatment plan. She is on the keeping a food journal and adhering to recommended goals of 1700-1800 calories and 100 grams of protein and states she is following her eating plan approximately 25-30 % of the time. She states she is dancing 2 hours per week.   OBJECTIVE: Visit Diagnoses: Problem List Items Addressed This Visit       Other   Morbid obesity (HCC) - Primary   Relevant Medications   semaglutide -weight management (WEGOVY ) 0.5 MG/0.5ML SOAJ SQ injection   Other Visit Diagnoses       Stress           Vitals Temp: 98 F (36.7 C) BP: 122/81 Pulse Rate: 81 SpO2: 98 %   Anthropometric Measurements Height: 5' 5 (1.651 m) Weight: 237 lb (107.5 kg) BMI (Calculated): 39.44 Weight at Last Visit: 239 lb Weight Lost Since Last Visit: 2 Weight Gained Since Last Visit: 0 Starting Weight: 250 lb Total Weight Loss (lbs): 13 lb (5.897 kg)   Body Composition  Body Fat %: 44.8 % Fat Mass (lbs): 106.2 lbs Muscle Mass (lbs): 124.2 lbs Total Body Water (lbs): 89.2 lbs Visceral Fat Rating : 12   Other Clinical Data Today's Visit #: 14 Starting Date: 09/19/23 Comments: 1700-1800/100     ASSESSMENT AND PLAN: Assessment & Plan Stress  BMI 39.0-39.9,adult  Morbid obesity (HCC)    Diet: Heidi Schmitt is currently in the action stage of change. As such, her goal is to continue with weight loss efforts and has agreed to keeping a food journal and adhering to recommended goals of 1700-1800 calories and 100 or more grams of  protein.   Exercise:  All adults should avoid inactivity. Some activity is better than none, and adults who participate in any amount of physical activity, gain some health benefits.  Behavior Modification:  We discussed the following Behavioral Modification Strategies today: increasing lean protein intake, decreasing simple carbohydrates, meal planning and cooking strategies, planning for success, and keep a strict food journal. We discussed various medication options to help Heidi Schmitt with her weight loss efforts and we both agreed to increase Wegovy  to 0.5mg  weekly.  Return in about 3 weeks (around 07/16/2024).   She was informed of the importance of frequent follow up visits to maximize her success with intensive lifestyle modifications for her multiple health conditions.  Attestation Statements:   Reviewed by clinician on day of visit: allergies, medications, problem list, medical history, surgical history, family history, social history, and previous encounter notes.   Heidi Cho, MD

## 2024-06-26 NOTE — Assessment & Plan Note (Signed)
 On metformin  with no side effects of cramping, bloating or diarrhea.  Will continue on current metformin  dosage as this in conjunction of GLP1 agonist usage is assisting in managing her blood sugars.  Metformin  refill sent to pharmacy.

## 2024-06-26 NOTE — Assessment & Plan Note (Signed)
 Blood pressure well controlled today and she needs a refill of current medication.  No chest pain, chest pressure or headache.  No change in current dosage.

## 2024-07-17 ENCOUNTER — Ambulatory Visit (INDEPENDENT_AMBULATORY_CARE_PROVIDER_SITE_OTHER): Payer: PRIVATE HEALTH INSURANCE | Admitting: Family Medicine

## 2024-07-17 ENCOUNTER — Encounter (INDEPENDENT_AMBULATORY_CARE_PROVIDER_SITE_OTHER): Payer: Self-pay | Admitting: Family Medicine

## 2024-07-17 VITALS — BP 121/81 | HR 81 | Temp 98.0°F | Ht 65.0 in | Wt 237.0 lb

## 2024-07-17 DIAGNOSIS — Z6839 Body mass index (BMI) 39.0-39.9, adult: Secondary | ICD-10-CM | POA: Diagnosis not present

## 2024-07-17 DIAGNOSIS — F439 Reaction to severe stress, unspecified: Secondary | ICD-10-CM | POA: Diagnosis not present

## 2024-07-17 MED ORDER — BUSPIRONE HCL 5 MG PO TABS: 5.0000 mg | ORAL_TABLET | Freq: Three times a day (TID) | ORAL | 0 refills | Status: DC

## 2024-07-17 MED ORDER — WEGOVY 0.5 MG/0.5ML ~~LOC~~ SOAJ: 0.5000 mg | SUBCUTANEOUS | 0 refills | Status: DC

## 2024-07-17 NOTE — Progress Notes (Signed)
 SUBJECTIVE:  Chief Complaint: Obesity  Interim History: Patient experiencing significant stress still about her house construction and updates.  She is sleeping poorly and taking trazadone for sleep.  She voices that she is wondering about increasing the frequency of Wegovy  to every 7 days in stead of every 10.  She is still occasionally getting waves of nausea.  In terms of food intake she is doing the best she can in terms of protein.  Quantity is limited in terms of food intake.   Heidi Schmitt is here to discuss her progress with her obesity treatment plan. She is on the keeping a food journal and adhering to recommended goals of 1700-1800 calories and 100 grams of protein and states she is following her eating plan approximately 25 % of the time. She states she is dancing 2 hours per week if able.   OBJECTIVE: Visit Diagnoses: Problem List Items Addressed This Visit       Other   Morbid obesity (HCC)   Relevant Medications   semaglutide -weight management (WEGOVY ) 0.5 MG/0.5ML SOAJ SQ injection   Other Visit Diagnoses       Stress       Relevant Medications   busPIRone (BUSPAR) 5 MG tablet       Vitals Temp: 98 F (36.7 C) BP: 121/81 Pulse Rate: 81 SpO2: 99 %   Anthropometric Measurements Height: 5' 5 (1.651 m) Weight: 237 lb (107.5 kg) BMI (Calculated): 39.44 Weight at Last Visit: 237 lb Weight Lost Since Last Visit: 0 Weight Gained Since Last Visit: 0 Starting Weight: 250 lb Total Weight Loss (lbs): 13 lb (5.897 kg)   Body Composition  Body Fat %: 44.9 % Fat Mass (lbs): 106.6 lbs Muscle Mass (lbs): 124.4 lbs Total Body Water (lbs): 89.8 lbs Visceral Fat Rating : 12   Other Clinical Data Today's Visit #: 15 Starting Date: 09/19/23 Comments: 1700-1800/100     ASSESSMENT AND PLAN: Assessment & Plan Stress Given recent scamming out of significant tents of thousands of dollars by contractor patient is understandably stressed.  Prescription for BuSpar  given with instructions to take up to 3 times a day as needed for anxiety to will help patient function and get through her daily life.  Will follow-up on symptom control at next appointment. Morbid obesity (HCC)  BMI 39.0-39.9,adult    Diet: Kiaraliz is currently in the action stage of change. As such, her goal is to continue with weight loss efforts and has agreed to keeping a food journal and adhering to recommended goals of 1700-1800 calories and 100 or more grams of protein.   Exercise:  For substantial health benefits, adults should do at least 150 minutes (2 hours and 30 minutes) a week of moderate-intensity, or 75 minutes (1 hour and 15 minutes) a week of vigorous-intensity aerobic physical activity, or an equivalent combination of moderate- and vigorous-intensity aerobic activity. Aerobic activity should be performed in episodes of at least 10 minutes, and preferably, it should be spread throughout the week.  Behavior Modification:  We discussed the following Behavioral Modification Strategies today: increasing lean protein intake, decreasing simple carbohydrates, increasing vegetables, meal planning and cooking strategies, keeping healthy foods in the home, and planning for success. We discussed various medication options to help Kirk with her weight loss efforts and we both agreed to continue wegovy  at current dose but increase frequency from every 10 days to every 7.  Return in about 3 weeks (around 08/07/2024).   She was informed of the importance of  frequent follow up visits to maximize her success with intensive lifestyle modifications for her multiple health conditions.  Attestation Statements:   Reviewed by clinician on day of visit: allergies, medications, problem list, medical history, surgical history, family history, social history, and previous encounter notes.   Adelita Cho, MD

## 2024-07-24 ENCOUNTER — Ambulatory Visit (INDEPENDENT_AMBULATORY_CARE_PROVIDER_SITE_OTHER): Payer: PRIVATE HEALTH INSURANCE | Admitting: Family Medicine

## 2024-08-06 ENCOUNTER — Ambulatory Visit (INDEPENDENT_AMBULATORY_CARE_PROVIDER_SITE_OTHER): Payer: PRIVATE HEALTH INSURANCE | Admitting: Family Medicine

## 2024-08-06 VITALS — BP 124/81 | HR 86 | Temp 97.9°F | Ht 65.0 in | Wt 234.0 lb

## 2024-08-06 DIAGNOSIS — Z6838 Body mass index (BMI) 38.0-38.9, adult: Secondary | ICD-10-CM | POA: Diagnosis not present

## 2024-08-06 DIAGNOSIS — E782 Mixed hyperlipidemia: Secondary | ICD-10-CM | POA: Diagnosis not present

## 2024-08-06 DIAGNOSIS — I10 Essential (primary) hypertension: Secondary | ICD-10-CM | POA: Diagnosis not present

## 2024-08-06 MED ORDER — LOSARTAN POTASSIUM 50 MG PO TABS: 25.0000 mg | ORAL_TABLET | Freq: Every day | ORAL | 0 refills | Status: DC

## 2024-08-06 MED ORDER — WEGOVY 0.5 MG/0.5ML ~~LOC~~ SOAJ: 0.5000 mg | SUBCUTANEOUS | 0 refills | Status: DC

## 2024-08-06 NOTE — Progress Notes (Signed)
 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  She went and saw ortho and got 45ml of bloody fluid out of her left knee.  She is having PRP done on December 15th for knee pain.  Physical therapy told her not to dance for 8 weeks at minimum.  Eating wise she is trying to get all her food intake in.  She can't get all the food in that she did previously.  She is able to tolerate her injections every 10 days but when she went down to 7 days she was having indigestion, reflux and abdominal discomfort.  For Thanksgiving she is cooking a chicken.    Heidi Schmitt is here to discuss her progress with her obesity treatment plan. She is on the keeping a food journal and adhering to recommended goals of 1700-1800 calories and 100 grams of protein and states she is following her eating plan approximately 25-30 % of the time. She states she is not exercising.  OBJECTIVE: Visit Diagnoses: Problem List Items Addressed This Visit       Cardiovascular and Mediastinum   Hypertension   Relevant Medications   losartan  (COZAAR ) 50 MG tablet     Other   Morbid obesity (HCC)   Relevant Medications   semaglutide -weight management (WEGOVY ) 0.5 MG/0.5ML SOAJ SQ injection    Vitals Temp: 97.9 F (36.6 C) BP: 124/81 Pulse Rate: 86 SpO2: 99 %   Anthropometric Measurements Height: 5' 5 (1.651 m) Weight: 234 lb (106.1 kg) BMI (Calculated): 38.94 Weight at Last Visit: 237 lb Weight Lost Since Last Visit: 3 Weight Gained Since Last Visit: 0 Starting Weight: 250 lb Total Weight Loss (lbs): 13 lb (5.897 kg)   Body Composition  Body Fat %: 45.5 % Fat Mass (lbs): 106.8 lbs Muscle Mass (lbs): 121.6 lbs Total Body Water (lbs): 88.8 lbs Visceral Fat Rating : 12   Other Clinical Data Today's Visit #: 16 Starting Date: 09/19/23 Comments: 1700-1800/100     ASSESSMENT AND PLAN: Assessment & Plan Primary  hypertension Blood pressure well-controlled on losartan  25 mg daily.  Blood pressure has stayed controlled despite significant stress involving home remodel.  Needs refill of losartan  today.  No chest pain, chest pressure, or headache reported Mixed hyperlipidemia Last LDL elevated at 140, HDL low at 37, triglycerides controlled at 117.  Patient has been unable to implement significant amount of activity due to knee pain.  She was previously line dancing consistent times during the week.  Will need repeat labs at next appointment. BMI 38.0-38.9,adult  Morbid obesity (HCC)    Diet: Lesslie is currently in the action stage of change. As such, her goal is to continue with weight loss efforts and has agreed to keeping a food journal and adhering to recommended goals of 1700-1800 calories and 100 or more grams of protein daily   Exercise:  All adults should avoid inactivity. Some activity is better than none, and adults who participate in any amount of physical activity, gain some health benefits.  Behavior Modification:  We discussed the following Behavioral Modification Strategies today: increasing lean protein intake, decreasing simple carbohydrates, increasing vegetables, meal planning and cooking strategies, planning for success, and keep a strict food journal. We discussed various medication options to help March with her weight loss efforts and we both agreed to continue wegovy  at current dose.  Return in about 3 weeks (around 08/27/2024).   She was informed of the importance  of frequent follow up visits to maximize her success with intensive lifestyle modifications for her multiple health conditions.  Attestation Statements:   Reviewed by clinician on day of visit: allergies, medications, problem list, medical history, surgical history, family history, social history, and previous encounter notes.   Adelita Cho, MD

## 2024-08-12 NOTE — Assessment & Plan Note (Signed)
 Blood pressure well-controlled on losartan  25 mg daily.  Blood pressure has stayed controlled despite significant stress involving home remodel.  Needs refill of losartan  today.  No chest pain, chest pressure, or headache reported

## 2024-08-12 NOTE — Assessment & Plan Note (Signed)
 Last LDL elevated at 140, HDL low at 37, triglycerides controlled at 117.  Patient has been unable to implement significant amount of activity due to knee pain.  She was previously line dancing consistent times during the week.  Will need repeat labs at next appointment.

## 2024-08-27 ENCOUNTER — Encounter (INDEPENDENT_AMBULATORY_CARE_PROVIDER_SITE_OTHER): Payer: Self-pay | Admitting: Family Medicine

## 2024-08-27 ENCOUNTER — Ambulatory Visit (INDEPENDENT_AMBULATORY_CARE_PROVIDER_SITE_OTHER): Payer: PRIVATE HEALTH INSURANCE | Admitting: Family Medicine

## 2024-08-27 VITALS — BP 111/67 | HR 76 | Temp 98.0°F | Ht 65.0 in | Wt 238.0 lb

## 2024-08-27 DIAGNOSIS — Z6839 Body mass index (BMI) 39.0-39.9, adult: Secondary | ICD-10-CM

## 2024-08-27 DIAGNOSIS — E782 Mixed hyperlipidemia: Secondary | ICD-10-CM | POA: Diagnosis not present

## 2024-08-27 DIAGNOSIS — R7303 Prediabetes: Secondary | ICD-10-CM

## 2024-08-27 MED ORDER — WEGOVY 1 MG/0.5ML ~~LOC~~ SOAJ: 1.0000 mg | SUBCUTANEOUS | 1 refills | Status: DC

## 2024-08-27 NOTE — Progress Notes (Signed)
 "  SUBJECTIVE:  Chief Complaint: Obesity  Interim History: Patient got PRP injection in her knee two days ago.  She is taking tylenol  for pain control now.  Her goal is to get back dancing January 29th.  Her eating is getting slightly improved- she can get her goal intake 3 days after injection.  She is starting to crave salt more than sugar.  Planning to stay local for Christmas and New Years.   Heidi Schmitt is here to discuss her progress with her obesity treatment plan. She is on the keeping a food journal and adhering to recommended goals of 1700-1800 calories and 100 grams of protein and states she is following her eating plan approximately 40 % of the time. She states she is not exercising.  OBJECTIVE: Visit Diagnoses: Problem List Items Addressed This Visit       Other   Prediabetes - Primary   Hyperlipidemia   Morbid obesity (HCC)   Relevant Medications   semaglutide -weight management (WEGOVY ) 1 MG/0.5ML SOAJ SQ injection   Other Visit Diagnoses       BMI 39.0-39.9,adult           Vitals Temp: 98 F (36.7 C) BP: 111/67 Pulse Rate: 76 SpO2: 97 %   Anthropometric Measurements Height: 5' 5 (1.651 m) Weight: 238 lb (108 kg) BMI (Calculated): 39.61 Weight at Last Visit: 234 lb Weight Lost Since Last Visit: 0 Weight Gained Since Last Visit: 4 Starting Weight: 250 lb Total Weight Loss (lbs): 12 lb (5.443 kg)   Body Composition  Body Fat %: 46.5 % Fat Mass (lbs): 110.6 lbs Muscle Mass (lbs): 121 lbs Total Body Water (lbs): 90.8 lbs Visceral Fat Rating : 13   Other Clinical Data Today's Visit #: 17 Starting Date: 09/19/23 Comments: 1700-1800/100     ASSESSMENT AND PLAN: Assessment & Plan Prediabetes Patient has been working on monitoring her simple carbohydrate intake and focusing on ensuring adequate protein intake at this time.  Will need repeat labs done at next appointment. Mixed hyperlipidemia Last lipid panel showing elevated LDL at 140 with a low  HDL at 37.  Patient has been working on lifestyle modifications including physical activity implementation and limiting saturated fat intake.  Will need repeat labs in the next month. BMI 39.0-39.9,adult  Morbid obesity (HCC)    Diet: Heidi Schmitt is currently in the action stage of change. As such, her goal is to continue with weight loss efforts and has agreed to keeping a food journal and adhering to recommended goals of 1700-1800 calories and 120 or more grams of protein.   Exercise:  For substantial health benefits, adults should do at least 150 minutes (2 hours and 30 minutes) a week of moderate-intensity, or 75 minutes (1 hour and 15 minutes) a week of vigorous-intensity aerobic physical activity, or an equivalent combination of moderate- and vigorous-intensity aerobic activity. Aerobic activity should be performed in episodes of at least 10 minutes, and preferably, it should be spread throughout the week.  Behavior Modification:  We discussed the following Behavioral Modification Strategies today: increasing lean protein intake, decreasing simple carbohydrates, meal planning and cooking strategies, holiday eating strategies, and keep a strict food journal. We discussed various medication options to help Heidi Schmitt with her weight loss efforts and we both agreed to increase wegovy  to 1mg  weekly. Follow-up in 4 weeks.  She was informed of the importance of frequent follow up visits to maximize her success with intensive lifestyle modifications for her multiple health conditions.  Attestation Statements:  Reviewed by clinician on day of visit: allergies, medications, problem list, medical history, surgical history, family history, social history, and previous encounter notes.   Adelita Cho, MD "

## 2024-09-08 NOTE — Assessment & Plan Note (Signed)
 Patient has been working on monitoring her simple carbohydrate intake and focusing on ensuring adequate protein intake at this time.  Will need repeat labs done at next appointment.

## 2024-09-08 NOTE — Assessment & Plan Note (Signed)
 Last lipid panel showing elevated LDL at 140 with a low HDL at 37.  Patient has been working on lifestyle modifications including physical activity implementation and limiting saturated fat intake.  Will need repeat labs in the next month.

## 2024-09-24 ENCOUNTER — Ambulatory Visit (INDEPENDENT_AMBULATORY_CARE_PROVIDER_SITE_OTHER): Payer: PRIVATE HEALTH INSURANCE | Admitting: Family Medicine

## 2024-10-02 ENCOUNTER — Telehealth (INDEPENDENT_AMBULATORY_CARE_PROVIDER_SITE_OTHER): Payer: Self-pay

## 2024-10-02 ENCOUNTER — Encounter (INDEPENDENT_AMBULATORY_CARE_PROVIDER_SITE_OTHER): Payer: Self-pay | Admitting: Family Medicine

## 2024-10-02 ENCOUNTER — Ambulatory Visit (INDEPENDENT_AMBULATORY_CARE_PROVIDER_SITE_OTHER): Admitting: Family Medicine

## 2024-10-02 DIAGNOSIS — J4540 Moderate persistent asthma, uncomplicated: Secondary | ICD-10-CM

## 2024-10-02 DIAGNOSIS — I10 Essential (primary) hypertension: Secondary | ICD-10-CM

## 2024-10-02 DIAGNOSIS — F439 Reaction to severe stress, unspecified: Secondary | ICD-10-CM

## 2024-10-02 DIAGNOSIS — Z6841 Body Mass Index (BMI) 40.0 and over, adult: Secondary | ICD-10-CM

## 2024-10-02 DIAGNOSIS — B353 Tinea pedis: Secondary | ICD-10-CM

## 2024-10-02 MED ORDER — ZEPBOUND 2.5 MG/0.5ML ~~LOC~~ SOAJ: 2.5000 mg | SUBCUTANEOUS | 0 refills | Status: AC

## 2024-10-02 MED ORDER — MONTELUKAST SODIUM 10 MG PO TABS: 10.0000 mg | ORAL_TABLET | Freq: Every day | ORAL | 1 refills | Status: AC

## 2024-10-02 MED ORDER — LOSARTAN POTASSIUM 50 MG PO TABS: 25.0000 mg | ORAL_TABLET | Freq: Every day | ORAL | 0 refills | Status: AC

## 2024-10-02 MED ORDER — CLOTRIMAZOLE 1 % EX CREA: 1.0000 | TOPICAL_CREAM | Freq: Two times a day (BID) | CUTANEOUS | 0 refills | Status: AC

## 2024-10-02 MED ORDER — BUSPIRONE HCL 5 MG PO TABS: 5.0000 mg | ORAL_TABLET | Freq: Three times a day (TID) | ORAL | 0 refills | Status: AC

## 2024-10-02 NOTE — Telephone Encounter (Signed)
 [facility]  Prior Authorization  Information regarding your request Refill too soon. Next refill is available on or after 10/22/24

## 2024-10-16 ENCOUNTER — Ambulatory Visit (HOSPITAL_BASED_OUTPATIENT_CLINIC_OR_DEPARTMENT_OTHER)
Admission: EM | Admit: 2024-10-16 | Discharge: 2024-10-16 | Disposition: A | Discharge status: Home / Self Care | Source: Home / Self Care

## 2024-10-16 ENCOUNTER — Other Ambulatory Visit (HOSPITAL_BASED_OUTPATIENT_CLINIC_OR_DEPARTMENT_OTHER): Payer: Self-pay

## 2024-10-16 ENCOUNTER — Encounter (HOSPITAL_BASED_OUTPATIENT_CLINIC_OR_DEPARTMENT_OTHER): Payer: Self-pay

## 2024-10-16 DIAGNOSIS — H9201 Otalgia, right ear: Secondary | ICD-10-CM | POA: Diagnosis not present

## 2024-10-16 MED ORDER — AMOXICILLIN-POT CLAVULANATE 875-125 MG PO TABS
1.0000 | ORAL_TABLET | Freq: Two times a day (BID) | ORAL | 0 refills | Status: AC
  Filled 2024-10-16: qty 14, 7d supply, fill #0

## 2024-10-16 NOTE — Discharge Instructions (Addendum)
 Treating for ear infection.  Take the antibiotics as prescribed.  OTC meds as needed for pain and congestion.  Follow up as needed.

## 2024-10-16 NOTE — ED Triage Notes (Signed)
 Pt c/o bilateral ear pain and dizziness for the last 3-4 days. She was at a dance event over the weekend and got a bug. She was able to take otc meds for her symptoms but now she is concerned she has an ear infection from the drainage.

## 2024-10-16 NOTE — ED Provider Notes (Signed)
 " [facility]    CSN: [id number] Arrival date & time: 10/16/24  1125      History   Chief Complaint Chief Complaint  Patient presents with   Otalgia    HPI Heidi Schmitt is a 44 y.o. female.   Pt c/o bilateral ear pain and dizziness for the last 3-4 days. She was at a dance event over the weekend and picked up a virus. She was able to take otc meds for her symptoms but now she is concerned she has an ear infection from the drainage and right ear pain. Hx of tympanoplasty. No fever.     Otalgia   Past Medical History:  Diagnosis Date   Anxiety    Asthma    Depression    Diabetes mellitus    Gallbladder problem    Gestational diabetes    diet controlled   H/O varicella    Heartburn    HTN (hypertension)    Joint pain    Multiple food allergies    OA (osteoarthritis)    Prediabetes    Pregnancy induced hypertension    Seasonal allergies    Sleep apnea    SOB (shortness of breath)    Urticaria    Vitamin D  deficiency     Patient Active Problem List   Diagnosis Date Noted   Polyphagia 01/14/2024   Transaminitis 10/03/2023   Morbid obesity (HCC) 10/03/2023   Prediabetes 09/19/2023   Hypertension 09/19/2023   OSA (obstructive sleep apnea) 09/19/2023   Hyperlipidemia 09/19/2023   Vitamin D  deficiency 09/19/2023    Past Surgical History:  Procedure Laterality Date   CESAREAN SECTION  07/04/2012   Procedure: CESAREAN SECTION;  Surgeon: Duwaine Blumenthal, DO;  Location: [facility];  Service: Obstetrics;  Laterality: N/A;  Primary cesarean section with delivery of baby boy at 0702. Apgars7/9.   CHOLECYSTECTOMY  08/2015   SEPTOPLASTY  05/2015   Septoplasty and adenoidectomy   TONSILLECTOMY     TYMPANOPLASTY      OB History     Gravida  2   Para  2   Term  2   Preterm  0   AB  0   Living  2      SAB  0   IAB  0   Ectopic  0   Multiple  0   Live Births  2            Home Medications    Prior to Admission medications   Medication Sig Start Date End Date Taking? Authorizing Provider  amoxicillin -clavulanate (AUGMENTIN ) 875-125 MG tablet Take 1 tablet by mouth every 12 (twelve) hours. 10/16/24  Yes Emmitte Surgeon A, FNP  acetaminophen  (TYLENOL ) 500 MG tablet Take 500 mg by mouth every 6 (six) hours as needed.    [provider]  albuterol  (VENTOLIN  HFA) 108 (90 Base) MCG/ACT inhaler 2 puffs every 4-6 hours as needed 12/01/20   Kozlow, Eric J, MD  busPIRone  (BUSPAR ) 5 MG tablet Take 1 tablet (5 mg total) by mouth 3 (three) times daily. 10/02/24   [place] Adelita PENNER, MD  clotrimazole  (LOTRIMIN ) 1 % cream Apply 1 Application topically 2 (two) times daily. 10/02/24   [place] Adelita PENNER, MD  cyclobenzaprine (FLEXERIL) 10 MG tablet Take 10 mg by mouth 3 (three) times daily as needed for muscle spasms.    [provider]  diclofenac (CATAFLAM) 50 MG tablet Take 50 mg by mouth. Has 50mg  x 3 qd prn and 75mg  x 2  every day prn 12/26/23   [provider]  diclofenac (VOLTAREN) 50 MG EC tablet Take 50 mg by mouth 2 (two) times daily. Patient not taking: Reported on 10/02/2024    [provider]  diphenhydrAMINE  (BENADRYL ) 25 mg capsule Take 25 mg by mouth every 6 (six) hours as needed. 25-50mg     [provider]  Docusate Sodium  (STOOL SOFTENER LAXATIVE PO) Take by mouth.    [provider]  levonorgestrel (MIRENA) 20 MCG/24HR IUD by Intrauterine route.    [provider]  losartan  (COZAAR ) 50 MG tablet Take 0.5 tablets (25 mg total) by mouth daily. 10/02/24   [place] Adelita PENNER, MD  MAGNESIUM GLYCINATE PO Take 400 mg by mouth. Patient taking differently: Take 400 mg by mouth. Combo Magnesium    [provider]  metFORMIN  (GLUCOPHAGE -XR) 500 MG 24 hr tablet Take 500 mg by mouth daily with breakfast. 03/20/24   [provider]  montelukast  (SINGULAIR ) 10 MG tablet Take 1 tablet (10 mg total) by mouth at bedtime. 10/02/24   [place] Adelita PENNER, MD   tirzepatide  (ZEPBOUND ) 2.5 MG/0.5ML Pen Inject 2.5 mg into the skin once a week. 10/02/24   [place] Adelita PENNER, MD  traZODone (DESYREL) 50 MG tablet Take 100 mg by mouth. 01/25/17   [provider]  Turmeric (QC TUMERIC COMPLEX) 500 MG CAPS Take 1,500 mg by mouth. Patient not taking: Reported on 10/02/2024    [provider]    Family History Family History  Problem Relation Age of Onset   Hyperlipidemia Mother    Hypertension Mother    Asthma Mother    Sleep apnea Mother    Heart disease Father    Alcohol abuse Father    Sudden death Father    Heart disease Maternal Grandfather    Diabetes Maternal Grandfather    Hypertension Maternal Grandfather    Diabetes Maternal Aunt    Hypertension Maternal Aunt    Asthma Maternal Aunt    Birth defects Other        Angel Man syndrome   Other Neg Hx    Allergic rhinitis Neg Hx    Angioedema Neg Hx    Atopy Neg Hx    Eczema Neg Hx    Immunodeficiency Neg Hx    Urticaria Neg Hx     Social History Social History[1]   Allergies   Ace inhibitors, Azithromycin, Gluten meal, and Tape   Review of Systems Review of Systems  HENT:  Positive for ear pain.      Physical Exam Triage Vital Signs ED Triage Vitals  Encounter Vitals Group     BP 10/16/24 1153 136/85     Girls Systolic BP Percentile --      Girls Diastolic BP Percentile --      Boys Systolic BP Percentile --      Boys Diastolic BP Percentile --      Pulse Rate 10/16/24 1153 82     Resp 10/16/24 1153 20     Temp 10/16/24 1153 97.9 F (36.6 C)     Temp Source 10/16/24 1153 Oral     SpO2 10/16/24 1153 97 %     Weight --      Height --      Head Circumference --      Peak Flow --      Pain Score 10/16/24 1150 6     Pain Loc --      Pain Education --  Exclude from Growth Chart --    No data found.  Updated Vital Signs BP 136/85 (BP Location: Right Arm)   Pulse 82   Temp 97.9 F (36.6 C) (Oral)   Resp 20   SpO2 97%   Visual  Acuity Right Eye Distance:   Left Eye Distance:   Bilateral Distance:    Right Eye Near:   Left Eye Near:    Bilateral Near:     Physical Exam Vitals and nursing note reviewed.  Constitutional:      General: She is not in acute distress.    Appearance: Normal appearance. She is not ill-appearing, toxic-appearing or diaphoretic.  HENT:     Right Ear: A PE tube is present. Tympanic membrane is erythematous and retracted.     Left Ear: A PE tube is present.     Nose: Congestion present.  Pulmonary:     Effort: Pulmonary effort is normal.  Neurological:     Mental Status: She is alert.  Psychiatric:        Mood and Affect: Mood normal.      UC Treatments / Results  Labs (all labs ordered are listed, but only abnormal results are displayed) Labs Reviewed - No data to display  EKG   Radiology No results found.  Procedures Procedures (including critical care time)  Medications Ordered in UC Medications - No data to display  Initial Impression / Assessment and Plan / UC Course  I have reviewed the triage vital signs and the nursing notes.  Pertinent labs & imaging results that were available during my care of the patient were reviewed by me and considered in my medical decision making (see chart for details).     Right ear pain- Treating for ear infection.  Take the antibiotics as prescribed.  OTC meds as needed for pain and congestion.  Follow up as needed.  Final Clinical Impressions(s) / UC Diagnoses   Final diagnoses:  Right ear pain     Discharge Instructions      Treating for ear infection.  Take the antibiotics as prescribed.  OTC meds as needed for pain and congestion.  Follow up as needed.      ED Prescriptions     Medication Sig Dispense Auth. Provider   amoxicillin -clavulanate (AUGMENTIN ) 875-125 MG tablet Take 1 tablet by mouth every 12 (twelve) hours. 14 tablet Adah Wilbert LABOR, FNP      PDMP not reviewed this encounter.     [1]   Social History Tobacco Use   Smoking status: Former    Current packs/day: 0.00    Types: Cigarettes    Quit date: 06/03/2005    Years since quitting: 19.3   Smokeless tobacco: Never  Substance Use Topics   Alcohol use: No   Drug use: No     Adah Wilbert LABOR, FNP 10/16/24 1457  "

## 2024-10-22 ENCOUNTER — Ambulatory Visit (INDEPENDENT_AMBULATORY_CARE_PROVIDER_SITE_OTHER): Admitting: Family Medicine

## 2024-10-22 ENCOUNTER — Ambulatory Visit (INDEPENDENT_AMBULATORY_CARE_PROVIDER_SITE_OTHER): Payer: PRIVATE HEALTH INSURANCE | Admitting: Family Medicine

## 2024-11-11 ENCOUNTER — Ambulatory Visit (INDEPENDENT_AMBULATORY_CARE_PROVIDER_SITE_OTHER): Admitting: Family Medicine
